# Patient Record
Sex: Female | Born: 1955 | Race: White | Hispanic: No | Marital: Married | Smoking: Former smoker
Health system: Southern US, Community
[De-identification: ages and names within clinical notes are randomized; demographics above are authoritative.]

## PROBLEM LIST (undated history)

## (undated) DIAGNOSIS — R519 Headache, unspecified: Secondary | ICD-10-CM

## (undated) DIAGNOSIS — F419 Anxiety disorder, unspecified: Secondary | ICD-10-CM

## (undated) DIAGNOSIS — R51 Headache: Secondary | ICD-10-CM

## (undated) DIAGNOSIS — C801 Malignant (primary) neoplasm, unspecified: Secondary | ICD-10-CM

## (undated) HISTORY — PX: BREAST EXCISIONAL BIOPSY: SUR124

---

## 1998-08-15 ENCOUNTER — Other Ambulatory Visit: Admission: RE | Admit: 1998-08-15 | Discharge: 1998-08-15 | Payer: Self-pay | Admitting: *Deleted

## 2003-01-25 ENCOUNTER — Other Ambulatory Visit: Admission: RE | Admit: 2003-01-25 | Discharge: 2003-01-25 | Payer: Self-pay | Admitting: Obstetrics and Gynecology

## 2003-03-15 ENCOUNTER — Encounter: Admission: RE | Admit: 2003-03-15 | Discharge: 2003-03-15 | Payer: Self-pay | Admitting: Obstetrics and Gynecology

## 2003-03-15 ENCOUNTER — Encounter: Payer: Self-pay | Admitting: Obstetrics and Gynecology

## 2016-05-24 DIAGNOSIS — C50919 Malignant neoplasm of unspecified site of unspecified female breast: Secondary | ICD-10-CM

## 2016-05-24 HISTORY — DX: Malignant neoplasm of unspecified site of unspecified female breast: C50.919

## 2016-05-24 HISTORY — PX: MASTECTOMY: SHX3

## 2017-02-17 ENCOUNTER — Other Ambulatory Visit: Payer: Self-pay | Admitting: Obstetrics and Gynecology

## 2017-02-17 DIAGNOSIS — N6320 Unspecified lump in the left breast, unspecified quadrant: Secondary | ICD-10-CM

## 2017-02-22 ENCOUNTER — Ambulatory Visit
Admission: RE | Admit: 2017-02-22 | Discharge: 2017-02-22 | Disposition: A | Discharge status: Home / Self Care | Payer: BLUE CROSS/BLUE SHIELD | Source: Ambulatory Visit | Attending: Obstetrics and Gynecology | Admitting: Obstetrics and Gynecology

## 2017-02-22 ENCOUNTER — Other Ambulatory Visit: Payer: Self-pay | Admitting: Obstetrics and Gynecology

## 2017-02-22 DIAGNOSIS — N6320 Unspecified lump in the left breast, unspecified quadrant: Secondary | ICD-10-CM

## 2017-02-22 DIAGNOSIS — R599 Enlarged lymph nodes, unspecified: Secondary | ICD-10-CM

## 2017-02-22 DIAGNOSIS — N6310 Unspecified lump in the right breast, unspecified quadrant: Secondary | ICD-10-CM

## 2017-02-25 ENCOUNTER — Other Ambulatory Visit: Payer: Self-pay | Admitting: Obstetrics and Gynecology

## 2017-02-25 DIAGNOSIS — R928 Other abnormal and inconclusive findings on diagnostic imaging of breast: Secondary | ICD-10-CM

## 2017-02-25 DIAGNOSIS — R922 Inconclusive mammogram: Secondary | ICD-10-CM

## 2017-02-28 ENCOUNTER — Ambulatory Visit
Admission: RE | Admit: 2017-02-28 | Discharge: 2017-02-28 | Disposition: A | Discharge status: Home / Self Care | Payer: BLUE CROSS/BLUE SHIELD | Source: Ambulatory Visit | Attending: Obstetrics and Gynecology | Admitting: Obstetrics and Gynecology

## 2017-02-28 ENCOUNTER — Other Ambulatory Visit: Payer: Self-pay | Admitting: Obstetrics and Gynecology

## 2017-02-28 DIAGNOSIS — N6320 Unspecified lump in the left breast, unspecified quadrant: Secondary | ICD-10-CM

## 2017-02-28 DIAGNOSIS — R599 Enlarged lymph nodes, unspecified: Secondary | ICD-10-CM

## 2017-03-03 ENCOUNTER — Encounter: Payer: Self-pay | Admitting: *Deleted

## 2017-03-03 ENCOUNTER — Telehealth: Payer: Self-pay | Admitting: Hematology and Oncology

## 2017-03-03 DIAGNOSIS — Z170 Estrogen receptor positive status [ER+]: Principal | ICD-10-CM

## 2017-03-03 DIAGNOSIS — C50312 Malignant neoplasm of lower-inner quadrant of left female breast: Secondary | ICD-10-CM | POA: Insufficient documentation

## 2017-03-03 NOTE — Telephone Encounter (Signed)
Confirmed BC appointment for 10/17 in the afternoon, patient wanted intake form emailed to her

## 2017-03-09 ENCOUNTER — Ambulatory Visit
Admission: RE | Admit: 2017-03-09 | Discharge: 2017-03-09 | Disposition: A | Discharge status: Home / Self Care | Payer: BLUE CROSS/BLUE SHIELD | Source: Ambulatory Visit | Attending: Radiation Oncology | Admitting: Radiation Oncology

## 2017-03-09 ENCOUNTER — Encounter: Payer: Self-pay | Admitting: Physical Therapy

## 2017-03-09 ENCOUNTER — Encounter: Payer: Self-pay | Admitting: Hematology and Oncology

## 2017-03-09 ENCOUNTER — Ambulatory Visit: Payer: Self-pay | Admitting: General Surgery

## 2017-03-09 ENCOUNTER — Ambulatory Visit: Payer: BLUE CROSS/BLUE SHIELD | Attending: General Surgery | Admitting: Physical Therapy

## 2017-03-09 ENCOUNTER — Encounter: Payer: Self-pay | Admitting: General Practice

## 2017-03-09 ENCOUNTER — Other Ambulatory Visit (HOSPITAL_BASED_OUTPATIENT_CLINIC_OR_DEPARTMENT_OTHER): Payer: BLUE CROSS/BLUE SHIELD

## 2017-03-09 ENCOUNTER — Ambulatory Visit (HOSPITAL_BASED_OUTPATIENT_CLINIC_OR_DEPARTMENT_OTHER): Payer: BLUE CROSS/BLUE SHIELD | Admitting: Hematology and Oncology

## 2017-03-09 VITALS — BP 165/76 | HR 74 | Temp 98.2°F | Resp 20 | Ht 64.0 in | Wt 135.5 lb

## 2017-03-09 DIAGNOSIS — Z170 Estrogen receptor positive status [ER+]: Principal | ICD-10-CM

## 2017-03-09 DIAGNOSIS — C50312 Malignant neoplasm of lower-inner quadrant of left female breast: Secondary | ICD-10-CM

## 2017-03-09 DIAGNOSIS — R309 Painful micturition, unspecified: Secondary | ICD-10-CM

## 2017-03-09 DIAGNOSIS — R293 Abnormal posture: Secondary | ICD-10-CM | POA: Insufficient documentation

## 2017-03-09 DIAGNOSIS — C50512 Malignant neoplasm of lower-outer quadrant of left female breast: Secondary | ICD-10-CM

## 2017-03-09 DIAGNOSIS — R350 Frequency of micturition: Secondary | ICD-10-CM

## 2017-03-09 LAB — URINALYSIS, MICROSCOPIC - CHCC
Bilirubin (Urine): NEGATIVE
Blood: NEGATIVE
GLUCOSE UR CHCC: NEGATIVE mg/dL
Ketones: 15 mg/dL
LEUKOCYTE ESTERASE: NEGATIVE
NITRITE: NEGATIVE
PH: 6 (ref 4.6–8.0)
PROTEIN: NEGATIVE mg/dL
Specific Gravity, Urine: 1.01 (ref 1.003–1.035)
Urobilinogen, UR: 0.2 mg/dL (ref 0.2–1)

## 2017-03-09 LAB — CBC WITH DIFFERENTIAL/PLATELET
BASO%: 0.2 % (ref 0.0–2.0)
BASOS ABS: 0 10*3/uL (ref 0.0–0.1)
EOS%: 0.6 % (ref 0.0–7.0)
Eosinophils Absolute: 0 10*3/uL (ref 0.0–0.5)
HEMATOCRIT: 41.2 % (ref 34.8–46.6)
HGB: 13.4 g/dL (ref 11.6–15.9)
LYMPH#: 1 10*3/uL (ref 0.9–3.3)
LYMPH%: 20.2 % (ref 14.0–49.7)
MCH: 30.2 pg (ref 25.1–34.0)
MCHC: 32.5 g/dL (ref 31.5–36.0)
MCV: 93 fL (ref 79.5–101.0)
MONO#: 0.4 10*3/uL (ref 0.1–0.9)
MONO%: 8 % (ref 0.0–14.0)
NEUT#: 3.7 10*3/uL (ref 1.5–6.5)
NEUT%: 71 % (ref 38.4–76.8)
PLATELETS: 238 10*3/uL (ref 145–400)
RBC: 4.43 10*6/uL (ref 3.70–5.45)
RDW: 13.4 % (ref 11.2–14.5)
WBC: 5.1 10*3/uL (ref 3.9–10.3)

## 2017-03-09 LAB — COMPREHENSIVE METABOLIC PANEL
ALT: 13 U/L (ref 0–55)
ANION GAP: 8 meq/L (ref 3–11)
AST: 15 U/L (ref 5–34)
Albumin: 4.1 g/dL (ref 3.5–5.0)
Alkaline Phosphatase: 78 U/L (ref 40–150)
BUN: 8.3 mg/dL (ref 7.0–26.0)
CALCIUM: 9.3 mg/dL (ref 8.4–10.4)
CHLORIDE: 108 meq/L (ref 98–109)
CO2: 26 mEq/L (ref 22–29)
Creatinine: 0.7 mg/dL (ref 0.6–1.1)
EGFR: >60 mL/min/{1.73_m2} (ref >60)
Glucose: 99 mg/dl (ref 70–140)
POTASSIUM: 4.2 meq/L (ref 3.5–5.1)
Sodium: 143 mEq/L (ref 136–145)
Total Bilirubin: 0.37 mg/dL (ref 0.20–1.20)
Total Protein: 7.1 g/dL (ref 6.4–8.3)

## 2017-03-09 NOTE — Progress Notes (Signed)
[facility] CONSULT NOTE  Patient Care Team: Richarda Overlie, MD as PCP - General (Obstetrics and Gynecology)  CHIEF COMPLAINTS/PURPOSE OF CONSULTATION:  Newly diagnosed breast cancer  HISTORY OF PRESENTING ILLNESS:  Adriana Jones 61 y.o. female is here because of recent diagnosis of left breast cancer. Patient felt a lump in the left breast and brought to the attention of her physician. She underwent a mammogram and ultrasound. She was noted to have 2 separate lesions one is 2.5 cm at 7:00 position another 2.5 cm at 6:00 position. Axilla is negative. There were additional indeterminate calcifications on the medial and lateral aspects of the masses. 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 Axilla was negative for malignancy. She was presented this morning in the multidisciplinary tumor board and she is here today to discuss the treatment plan.  I reviewed her records extensively and collaborated the history with the patient.  SUMMARY OF ONCOLOGIC HISTORY:   Malignant neoplasm of lower-inner quadrant of left breast in female, estrogen receptor positive ([facility])   02/28/2017 Initial Diagnosis    Palpable left breast masses 2.5 cm at 7:00 and another 2.5 cm at 6:00 1.9 cm apart biopsy: IDC grade 2 with DCISER 100%, PR 0%, Ki-67 20%, HER-2 positiveratio 2.08, axilla negative; in addition indeterminate calcifications medially and laterally,T2 N0 stage 2A clinical stage AJCC 8      MEDICAL HISTORY:  History reviewed. No pertinent past medical history.  SURGICAL HISTORY: Past Surgical History:  Procedure Laterality Date  . BREAST EXCISIONAL BIOPSY      SOCIAL HISTORY: Social History   Social History  . Marital status: Married    Spouse name: N/A  . Number of children: N/A  . Years of education: N/A   Occupational History  . Not on file.   Social History Main  Topics  . Smoking status: Former Smoker    Start date: 08/07/2013  . Smokeless tobacco: Not on file  . Alcohol use Yes     Comment: 4-10/wk  . Drug use: No  . Sexual activity: Not on file   Other Topics Concern  . Not on file   Social History Narrative  . No narrative on file    FAMILY HISTORY: History reviewed. No pertinent family history.  ALLERGIES:  is allergic to amoxicillin and sulfa antibiotics.  MEDICATIONS:  Current Outpatient Prescriptions  Medication Sig Dispense Refill  . Ascorbic Acid (VITAMIN C) 1000 MG tablet Take 1,000 mg by mouth daily.    Marland Kitchen CRANBERRY ULTRA STRENGTH PO Take 8,400 mg by mouth daily.     No current facility-administered medications for this visit.     REVIEW OF SYSTEMS:   Constitutional: Denies fevers, chills or abnormal night sweats Eyes: Denies blurriness of vision, double vision or watery eyes Ears, nose, mouth, throat, and face: Denies mucositis or sore throat Respiratory: Denies cough, dyspnea or wheezes Cardiovascular: Denies palpitation, chest discomfort or lower extremity swelling Gastrointestinal:  Denies nausea, heartburn or change in bowel habits Skin: Denies abnormal skin rashes Lymphatics: Denies new lymphadenopathy or easy bruising Neurological:Denies numbness, tingling or new weaknesses Behavioral/Psych: Mood is stable, no new changes  Breast:  Palpable lumps in the breasts that prompted the evaluation All other systems were reviewed with the patient and are negative.  PHYSICAL EXAMINATION: ECOG PERFORMANCE STATUS: 1 - Symptomatic but completely ambulatory  Vitals:   03/09/17  1258  BP: (!) 165/76  Pulse: 74  Resp: 20  Temp: 98.2 F (36.8 C)  SpO2: 99%   Filed Weights   03/09/17 1258  Weight: 135 lb 8 oz (61.5 kg)    GENERAL:alert, no distress and comfortable SKIN: skin color, texture, turgor are normal, no rashes or significant lesions EYES: normal, conjunctiva are pink and non-injected, sclera  clear OROPHARYNX:no exudate, no erythema and lips, buccal mucosa, and tongue normal  NECK: supple, thyroid normal size, non-tender, without nodularity LYMPH:  no palpable lymphadenopathy in the cervical, axillary or inguinal LUNGS: clear to auscultation and percussion with normal breathing effort HEART: regular rate & rhythm and no murmurs and no lower extremity edema ABDOMEN:abdomen soft, non-tender and normal bowel sounds Musculoskeletal:no cyanosis of digits and no clubbing  PSYCH: alert & oriented x 3 with fluent speech NEURO: no focal motor/sensory deficits BREAST palpable lesions in the left breast  LABORATORY DATA:  I have reviewed the data as listed Lab Results  Component Value Date   WBC 5.1 03/09/2017   HGB 13.4 03/09/2017   HCT 41.2 03/09/2017   MCV 93.0 03/09/2017   PLT 238 03/09/2017   Lab Results  Component Value Date   NA 143 03/09/2017   K 4.2 03/09/2017   CO2 26 03/09/2017    RADIOGRAPHIC STUDIES: I have personally reviewed the radiological reports and agreed with the findings in the report.  ASSESSMENT AND PLAN:  Malignant neoplasm of lower-inner quadrant of left breast in female, estrogen receptor positive ([facility]) 02/28/2017:Palpable left breast masses 2.5 cm at 7:00 and another 2.5 cm at 6:00 1.9 cm apart biopsy: IDC grade 2 with DCIS ER 100%, PR 0%, Ki-67 20%, HER-2 positiveratio 2.08, axilla negative; in addition indeterminate calcifications medially and laterally,T2 N0 stage 2A clinical stage AJCC 8  Pathology and radiology counseling: Discussed with the patient, the details of pathology including the type of breast cancer,the clinical staging, the significance of ER, PR and HER-2/neu receptors and the implications for treatment. After reviewing the pathology in detail, we proceeded to discuss the different treatment options between surgery, radiation, chemotherapy, antiestrogen therapies.  Recommendation: 1. Mastectomy with sentinel lymph node biopsy 2.  Adjuvant chemotherapy depending on the final tumor size this may be Taxol Herceptin versus TCH versus TCH Perjeta. 3. Adjuvant radiation therapy if necessary based upon final pathology 4. Adjuvant antiestrogen therapy  Perform additional biopsies for the indeterminate calcifications that were noted.  Plan: 1. Breast MRI 2. Port placement 3. Chemotherapy class 4. Echocardiogram  Follow-up after surgery to confirm the final treatment plan   All questions were answered. The patient knows to call the clinic with any problems, questions or concerns.    Rulon Eisenmenger, MD 03/09/17

## 2017-03-09 NOTE — Patient Instructions (Signed)

## 2017-03-09 NOTE — Progress Notes (Signed)
Radiation Oncology         [phone] ________________________________  Initial outpatient Consultation  Name: Adriana Jones MRN: [id number]  Date: 03/09/2017  DOB: 06/08/1955  KD:[email or web address], Delfino Lovett, MD  Jovita Kussmaul, MD   REFERRING PHYSICIAN: Autumn Messing III, MD  DIAGNOSIS:    ICD-10-CM   1. Malignant neoplasm of lower-inner quadrant of left breast in female, estrogen receptor positive ([facility]) C50.312    Z17.0    Cancer Staging Malignant neoplasm of lower-inner quadrant of left breast in female, estrogen receptor positive ([facility]) Staging form: Breast, AJCC 8th Edition - Clinical stage from 03/09/2017: Stage IIA (cT2, cN0, cM0, G2, ER: Positive, PR: Negative, HER2: Positive) - Unsigned   CHIEF COMPLAINT: Here to discuss management of left breast cancer.  HISTORY OF PRESENT ILLNESS::Adriana Jones is a 61 y.o. female who presented with palpable left breast mass. Patient has history of left breast excisional biospy. Mammogram revealed, in the post inferior left breast 2 adjacent masses plus the 4 mm group of calcification, in the upper outer quadrant left breast. There was also a 3.9 cm area of calcification in the medial left breast. On ultrasound, one of the masses was 2.5 cm at 7:00 and another was 2.7cm at 6:00. The axilla was noted for two mildly cortically thickening nodes.  She underwent three biopsies;  biopsy of the two breast masses revealed grade 2 Invasive ductal carcinoma with DCIS. ER 100% PR-, Her2+. Biopsy of left axillary node was negative.    Patient denies any SOB, or poor appetite. Heart has some palpitations. Patient reports having headaches, hearing loss, and ear drainage. Patient does use dentures, glasses, and contacts. Patient reports she had a UTI.   PREVIOUS RADIATION THERAPY: No  PAST MEDICAL HISTORY:  has no past medical history on file.    PAST SURGICAL HISTORY: Past Surgical History:  Procedure Laterality Date  . BREAST EXCISIONAL BIOPSY      FAMILY  HISTORY: family history is not on file.  SOCIAL HISTORY:  reports that she has quit smoking. She started smoking about 3 years ago. She does not have any smokeless tobacco history on file. She reports that she drinks alcohol. She reports that she does not use drugs. Patient reports vaping.  ALLERGIES: Amoxicillin and Sulfa antibiotics  MEDICATIONS:  Current Outpatient Prescriptions  Medication Sig Dispense Refill  . Ascorbic Acid (VITAMIN C) 1000 MG tablet Take 1,000 mg by mouth daily.    Marland Kitchen CRANBERRY ULTRA STRENGTH PO Take 8,400 mg by mouth daily.     No current facility-administered medications for this encounter.     REVIEW OF SYSTEMS: A 10+ POINT REVIEW OF SYSTEMS WAS OBTAINED including neurology, dermatology, psychiatry, cardiac, respiratory, lymph, extremities, GI, GU, Musculoskeletal, constitutional, breasts, reproductive, HEENT.  All pertinent positives are noted in the HPI.  All others are negative.   PHYSICAL EXAM:  Oncology Vitals 03/09/2017  Height 163 cm  Weight 61.462 kg  Weight (lbs) 135 lbs 8 oz  BMI (kg/m2) 23.26 kg/m2  Temp 98.2  Pulse 74  Resp 20  SpO2 99  BSA (m2) 1.67 m2   General: Alert and oriented, in no acute distress HEENT: Head is normocephalic. Extraocular movements are intact. Oropharynx is clear. Neck: Neck is supple, no palpable cervical or supraclavicular lymphadenopathy. Heart: Regular in rate and rhythm with no murmurs, rubs, or gallops. Chest: Clear to auscultation bilaterally, with no rhonchi, wheezes, or rales. Abdomen: Soft, nontender, nondistended, with no rigidity or guarding. Extremities: No cyanosis or edema. Skin: No concerning  lesions. Musculoskeletal: symmetric strength and muscle tone throughout. Neurologic: Cranial nerves II through XII are grossly intact. No obvious focalities. Speech is fluent. Coordination is intact. Psychiatric: Judgment and insight are intact. Affect is appropriate. BREAST: No palpable mass on left axilla.  Bruise on left side of breast:  4-5cm  palpable mass with bruise around 7:00 o'clock  Region. No palpable mass on right axilla or breast.     ECOG = 0  0 - Asymptomatic (Fully active, able to carry on all predisease activities without restriction)  1 - Symptomatic but completely ambulatory (Restricted in physically strenuous activity but ambulatory and able to carry out work of a light or sedentary nature. For example, light housework, office work)  2 - Symptomatic, <50% in bed during the day (Ambulatory and capable of all self care but unable to carry out any work activities. Up and about more than 50% of waking hours)  3 - Symptomatic, >50% in bed, but not bedbound (Capable of only limited self-care, confined to bed or chair 50% or more of waking hours)  4 - Bedbound (Completely disabled. Cannot carry on any self-care. Totally confined to bed or chair)  5 - Death   Eustace Pen MM, Creech RH, Tormey DC, et al. [phone]). "Toxicity and response criteria of the [facility]". [place] Oncol. 5 (6): 649-55   LABORATORY DATA:  Lab Results  Component Value Date   WBC 5.1 03/09/2017   HGB 13.4 03/09/2017   HCT 41.2 03/09/2017   MCV 93.0 03/09/2017   PLT 238 03/09/2017   CMP     Component Value Date/Time   NA 143 03/09/2017 1229   K 4.2 03/09/2017 1229   CO2 26 03/09/2017 1229   GLUCOSE 99 03/09/2017 1229   BUN 8.3 03/09/2017 1229   CREATININE 0.7 03/09/2017 1229   CALCIUM 9.3 03/09/2017 1229   PROT 7.1 03/09/2017 1229   ALBUMIN 4.1 03/09/2017 1229   AST 15 03/09/2017 1229   ALT 13 03/09/2017 1229   ALKPHOS 78 03/09/2017 1229   BILITOT 0.37 03/09/2017 1229         RADIOGRAPHY: US Breast Ltd Uni Left Inc Axilla  Result Date: 02/22/2017 CLINICAL DATA:  Patient presents for palpable abnormality within the inferior left breast. EXAM: 2D DIGITAL DIAGNOSTIC BILATERAL MAMMOGRAM WITH CAD AND ADJUNCT TOMO ULTRASOUND BILATERAL BREAST COMPARISON:  Previous exam(s).  ACR Breast Density Category c: The breast tissue is heterogeneously dense, which may obscure small masses. FINDINGS: Two adjacent lobular masses demonstrated within the inferior aspect of the left breast measuring 1.6 x 1.2 cm and 2.8 x 2.6 cm. Additionally there is a 4 mm group of rounded calcifications within the upper-outer left breast. Within the inferior slightly medial left breast there are loosely grouped round and punctate calcifications scattered within an area measuring approximately 3.9 x 3.9 cm. Within the inferior central right breast there is a small focal asymmetry. Mammographic images were processed with CAD. On physical exam, I palpate a firm mass within the inferior left breast. Targeted ultrasound is performed, showing a 2.5 x 1.6 x 2.1 cm mixed echogenicity irregular mass left breast 7 o'clock position 3 cm from the nipple, corresponding with palpable abnormality. Additionally there is a 2.7 x 2.1 x 2.7 cm heterogeneous solid mass within the left breast 6 o'clock position 3 cm from the nipple, adjacent to the palpable abnormality. There are 2 mildly cortically thickened left axillary lymph nodes measuring up to 4 mm and 5 mm in thickness. No  definite sonographic abnormality identified at the site of asymmetry within the right breast. IMPRESSION: Suspicious palpable left breast mass. Adjacent suspicious left breast mass. Indeterminate calcifications within the medial and lateral left breast. There are 2 mildly cortically thickened left axillary lymph nodes. RECOMMENDATION: Ultrasound-guided core needle biopsy of the 2 suspicious irregular hypoechoic left breast masses. Ultrasound-guided core needle biopsy of a cortically thickened left axillary lymph node. Based upon treatment plan, if the patient desires breast conservation therapy, stereotactic guided core needle biopsy of the medial and lateral left breast calcifications would be warranted. Given the breast density and findings on mammography,  consider further evaluation of the right and left breast with bilateral breast MRI. I have discussed the findings and recommendations with the patient. Results were also provided in writing at the conclusion of the visit. If applicable, a reminder letter will be sent to the patient regarding the next appointment. BI-RADS CATEGORY  5: Highly suggestive of malignancy. Electronically Signed   By: Lovey Newcomer M.D.   On: 02/22/2017 12:25   US Breast Ltd Uni Right Inc Axilla  Result Date: 02/22/2017 CLINICAL DATA:  Patient presents for palpable abnormality within the inferior left breast. EXAM: 2D DIGITAL DIAGNOSTIC BILATERAL MAMMOGRAM WITH CAD AND ADJUNCT TOMO ULTRASOUND BILATERAL BREAST COMPARISON:  Previous exam(s). ACR Breast Density Category c: The breast tissue is heterogeneously dense, which may obscure small masses. FINDINGS: Two adjacent lobular masses demonstrated within the inferior aspect of the left breast measuring 1.6 x 1.2 cm and 2.8 x 2.6 cm. Additionally there is a 4 mm group of rounded calcifications within the upper-outer left breast. Within the inferior slightly medial left breast there are loosely grouped round and punctate calcifications scattered within an area measuring approximately 3.9 x 3.9 cm. Within the inferior central right breast there is a small focal asymmetry. Mammographic images were processed with CAD. On physical exam, I palpate a firm mass within the inferior left breast. Targeted ultrasound is performed, showing a 2.5 x 1.6 x 2.1 cm mixed echogenicity irregular mass left breast 7 o'clock position 3 cm from the nipple, corresponding with palpable abnormality. Additionally there is a 2.7 x 2.1 x 2.7 cm heterogeneous solid mass within the left breast 6 o'clock position 3 cm from the nipple, adjacent to the palpable abnormality. There are 2 mildly cortically thickened left axillary lymph nodes measuring up to 4 mm and 5 mm in thickness. No definite sonographic abnormality  identified at the site of asymmetry within the right breast. IMPRESSION: Suspicious palpable left breast mass. Adjacent suspicious left breast mass. Indeterminate calcifications within the medial and lateral left breast. There are 2 mildly cortically thickened left axillary lymph nodes. RECOMMENDATION: Ultrasound-guided core needle biopsy of the 2 suspicious irregular hypoechoic left breast masses. Ultrasound-guided core needle biopsy of a cortically thickened left axillary lymph node. Based upon treatment plan, if the patient desires breast conservation therapy, stereotactic guided core needle biopsy of the medial and lateral left breast calcifications would be warranted. Given the breast density and findings on mammography, consider further evaluation of the right and left breast with bilateral breast MRI. I have discussed the findings and recommendations with the patient. Results were also provided in writing at the conclusion of the visit. If applicable, a reminder letter will be sent to the patient regarding the next appointment. BI-RADS CATEGORY  5: Highly suggestive of malignancy. Electronically Signed   By: Lovey Newcomer M.D.   On: 02/22/2017 12:25   Mm Diag Breast Tomo Bilateral  Result Date:  02/22/2017 CLINICAL DATA:  Patient presents for palpable abnormality within the inferior left breast. EXAM: 2D DIGITAL DIAGNOSTIC BILATERAL MAMMOGRAM WITH CAD AND ADJUNCT TOMO ULTRASOUND BILATERAL BREAST COMPARISON:  Previous exam(s). ACR Breast Density Category c: The breast tissue is heterogeneously dense, which may obscure small masses. FINDINGS: Two adjacent lobular masses demonstrated within the inferior aspect of the left breast measuring 1.6 x 1.2 cm and 2.8 x 2.6 cm. Additionally there is a 4 mm group of rounded calcifications within the upper-outer left breast. Within the inferior slightly medial left breast there are loosely grouped round and punctate calcifications scattered within an area measuring  approximately 3.9 x 3.9 cm. Within the inferior central right breast there is a small focal asymmetry. Mammographic images were processed with CAD. On physical exam, I palpate a firm mass within the inferior left breast. Targeted ultrasound is performed, showing a 2.5 x 1.6 x 2.1 cm mixed echogenicity irregular mass left breast 7 o'clock position 3 cm from the nipple, corresponding with palpable abnormality. Additionally there is a 2.7 x 2.1 x 2.7 cm heterogeneous solid mass within the left breast 6 o'clock position 3 cm from the nipple, adjacent to the palpable abnormality. There are 2 mildly cortically thickened left axillary lymph nodes measuring up to 4 mm and 5 mm in thickness. No definite sonographic abnormality identified at the site of asymmetry within the right breast. IMPRESSION: Suspicious palpable left breast mass. Adjacent suspicious left breast mass. Indeterminate calcifications within the medial and lateral left breast. There are 2 mildly cortically thickened left axillary lymph nodes. RECOMMENDATION: Ultrasound-guided core needle biopsy of the 2 suspicious irregular hypoechoic left breast masses. Ultrasound-guided core needle biopsy of a cortically thickened left axillary lymph node. Based upon treatment plan, if the patient desires breast conservation therapy, stereotactic guided core needle biopsy of the medial and lateral left breast calcifications would be warranted. Given the breast density and findings on mammography, consider further evaluation of the right and left breast with bilateral breast MRI. I have discussed the findings and recommendations with the patient. Results were also provided in writing at the conclusion of the visit. If applicable, a reminder letter will be sent to the patient regarding the next appointment. BI-RADS CATEGORY  5: Highly suggestive of malignancy. Electronically Signed   By: Lovey Newcomer M.D.   On: 02/22/2017 12:25   [place] Axillary Node Core Biopsy  Left  Addendum Date: 03/03/2017   ADDENDUM REPORT: 03/02/2017 14:24 ADDENDUM: Pathology revealed GRADE II INVASIVE DUCTAL CARCINOMA, DUCTAL CARCINOMA IN SITU of the Left breast, 7:00 o'clock (ribbon clip). GRADE II INVASIVE DUCTAL CARCINOMA of the Left breast, 6:00 o'clock (coil clip). Left axillary lymph node is NEGATIVE FOR CARCINOMA, (HydroMARK clip). This was found to be concordant by Dr. Curlene Dolphin. Pathology results were discussed with the patient by telephone. The patient reported doing well after the biopsies with tenderness at the sites. Post biopsy instructions and care were reviewed and questions were answered. The patient was encouraged to call [facility] for any additional concerns. The patient was referred to [facility] on March 09, 2017. Pathology results reported by Terie Purser, RN on 03/02/2017. Electronically Signed   By: Curlene Dolphin M.D.   On: 03/02/2017 14:24   Result Date: 03/03/2017 CLINICAL DATA:  Ultrasound-guided biopsy of a left axillary lymph node with mild cortical thickening was recommended. The patient also had 2 left breast biopsies today, dictated separately. EXAM:  [place] AXILLARY NODE CORE BIOPSY LEFT COMPARISON:  Previous exam(s). FINDINGS: I met with the patient and we discussed the procedure of ultrasound-guided biopsy, including benefits and alternatives. We discussed the high likelihood of a successful procedure. We discussed the risks of the procedure, including infection, bleeding, tissue injury, clip migration, and inadequate sampling. Informed written consent was given. The usual time-out protocol was performed immediately prior to the procedure. Using sterile technique and 1% Lidocaine as local anesthetic, under direct ultrasound visualization, a 14 gauge spring-loaded device was used to perform biopsy of an axillary lymph node with an approximately 4 mm  cortical thickness using a lateral to medial approach. At the conclusion of the procedure a HydroMARK tissue marker clip was deployed into the biopsy cavity. Follow up 2 view mammogram was performed and dictated separately. IMPRESSION: Ultrasound guided biopsy of a left axillary lymph node. No apparent complications. Electronically Signed: By: Curlene Dolphin M.D. On: 02/28/2017 16:10   Mm Clip Placement Left  Result Date: 02/28/2017 CLINICAL DATA:  Two left breast biopsies and a left axillary lymph node biopsy were performed today under ultrasound guidance. EXAM: DIAGNOSTIC LEFT MAMMOGRAM POST ULTRASOUND BIOPSIES COMPARISON:  Previous exam(s). FINDINGS: Mammographic images were obtained following ultrasound guided biopsies of 2 inferior left breast masses and of a left axillary lymph node. The ribbon shaped clip is positioned in the inferior left breast, but is more lateral than expected given that the mass was in the lower inner quadrant. This biopsy clip may have migrated slightly laterally. Coil shaped biopsy clip is satisfactorily positioned 6 o'clock region of the left breast. The axillary lymph node biopsy clip is not visualized on the mammogram, but could be seen on ultrasound at the time of biopsy. IMPRESSION: 1. Ribbon shaped biopsy clip in the inferior left breast is slightly more lateral than expected, and may have migrated slightly laterally. 2. Satisfactory position of coil shaped biopsy clip in the 6 o'clock position mass. 3. The axillary node biopsy clip is not visualized on in the mammogram, likely due to its position outside the field of view. Final Assessment: Post Procedure Mammograms for Marker Placement Electronically Signed   By: Curlene Dolphin M.D.   On: 02/28/2017 17:00   [place] Lt Breast Bx W Loc Dev 1st Lesion Img Bx Spec US Guide  Addendum Date: 03/03/2017   ADDENDUM REPORT: 03/02/2017 14:23 ADDENDUM: Pathology revealed GRADE II INVASIVE DUCTAL CARCINOMA, DUCTAL CARCINOMA IN SITU of the  Left breast, 7:00 o'clock (ribbon clip). GRADE II INVASIVE DUCTAL CARCINOMA of the Left breast, 6:00 o'clock (coil clip). Left axillary lymph node is NEGATIVE FOR CARCINOMA, (HydroMARK clip). This was found to be concordant by Dr. Curlene Dolphin. Pathology results were discussed with the patient by telephone. The patient reported doing well after the biopsies with tenderness at the sites. Post biopsy instructions and care were reviewed and questions were answered. The patient was encouraged to call [facility] for any additional concerns. The patient was referred to [facility] at [facility] on March 09, 2017. Pathology results reported by Terie Purser, RN on 03/02/2017. Electronically Signed   By: Curlene Dolphin M.D.   On: 03/02/2017 14:23   Result Date: 03/03/2017 CLINICAL DATA:  61 year old patient presents for 3 ultrasound-guided biopsies today. This report describes a biopsy of the left breast mass at 7 o'clock position 3 cm from the nipple. EXAM: ULTRASOUND GUIDED LEFT BREAST CORE NEEDLE BIOPSY COMPARISON:  Previous exam(s). FINDINGS:  I met with the patient and we discussed the procedure of ultrasound-guided biopsy, including benefits and alternatives. We discussed the high likelihood of a successful procedure. We discussed the risks of the procedure, including infection, bleeding, tissue injury, clip migration, and inadequate sampling. Informed written consent was given. The usual time-out protocol was performed immediately prior to the procedure. Lesion quadrant: Lower inner quadrant Using sterile technique and 1% Lidocaine as local anesthetic, under direct ultrasound visualization, a 12 gauge spring-loaded device was used to perform biopsy of a palpable suspicious mass approximately 7 o'clock position 3 cm from the nipple using a medial to lateral approach. At the conclusion of the procedure a ribbon shaped tissue  marker clip was deployed into the biopsy cavity. Follow up 2 view mammogram was performed and dictated separately. IMPRESSION: Ultrasound guided biopsy of left breast mass at 7 o'clock position 3 cm from the nipple. No apparent complications. Electronically Signed: By: Curlene Dolphin M.D. On: 02/28/2017 16:06   [place] Lt Breast Bx W Loc Dev Ea Add Lesion Img Bx Spec US Guide  Addendum Date: 03/03/2017   ADDENDUM REPORT: 03/02/2017 14:24 ADDENDUM: Pathology revealed GRADE II INVASIVE DUCTAL CARCINOMA, DUCTAL CARCINOMA IN SITU of the Left breast, 7:00 o'clock (ribbon clip). GRADE II INVASIVE DUCTAL CARCINOMA of the Left breast, 6:00 o'clock (coil clip). Left axillary lymph node is NEGATIVE FOR CARCINOMA, (HydroMARK clip). This was found to be concordant by Dr. Curlene Dolphin. Pathology results were discussed with the patient by telephone. The patient reported doing well after the biopsies with tenderness at the sites. Post biopsy instructions and care were reviewed and questions were answered. The patient was encouraged to call [facility] for any additional concerns. The patient was referred to [facility] at [facility] on March 09, 2017. Pathology results reported by Terie Purser, RN on 03/02/2017. Electronically Signed   By: Curlene Dolphin M.D.   On: 03/02/2017 14:24   Result Date: 03/03/2017 CLINICAL DATA:  61 year old patient presents for biopsies of 2 left breast masses and a left axillary lymph node. This biopsy describes the mass in the 6 o'clock region of the left breast near the inframammary fold. There is overlying skin dimpling. EXAM: ULTRASOUND GUIDED LEFT BREAST CORE NEEDLE BIOPSY COMPARISON:  Previous exam(s). FINDINGS: I met with the patient and we discussed the procedure of ultrasound-guided biopsy, including benefits and alternatives. We discussed the high likelihood of a successful procedure. We discussed the  risks of the procedure, including infection, bleeding, tissue injury, clip migration, and inadequate sampling. Informed written consent was given. The usual time-out protocol was performed immediately prior to the procedure. Lesion quadrant: Lower outer quadrant Using sterile technique and 1% Lidocaine as local anesthetic, under direct ultrasound visualization, a 12 gauge spring-loaded device was used to perform biopsy of an irregular mass in the 6 o'clock position near the inframammary fold, with possible extension into the overlying dermis using a lateral to medial approach. At the conclusion of the procedure a coil shaped tissue marker clip was deployed into the biopsy cavity. Follow up 2 view mammogram was performed and dictated separately. IMPRESSION: Ultrasound guided biopsy of left breast 6 o'clock position near the inframammary fold. No apparent complications. Electronically Signed: By: Curlene Dolphin M.D. On: 02/28/2017 16:09      IMPRESSION/PLAN: Left Breast Cancer   Recommended Mrs. Harding to stop vaping for her overall health.  Consensus is to proceed with mastectomy, followed by adjuvant systemic  therapy and radiotherapy only of needed. I explained that radiotherapy can be warranted in the post mastectomy setting, if nodes are positive, margins are positive, or the invasive extensive  disease expands several centimeters more than we anticipate. At this time clinically, she does not appear to need radiation, but that may change once we review her pathology. I will see her PRN.    __________________________________________   Eppie Gibson, MD  This document serves as a record of services personally performed by Eppie Gibson MD. It was created on her behalf by Delton Coombes, a trained medical scribe. The creation of this record is based on the scribe's personal observations and the provider's statements to them. This document has been checked and approved by the attending provider.

## 2017-03-09 NOTE — Assessment & Plan Note (Signed)
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 After reviewing the pathology in detail, we proceeded to discuss the different treatment options between surgery, radiation, chemotherapy, antiestrogen therapies.  Recommendation: 1. Neoadjuvant chemotherapy with [place] Perjeta every 3 weeks 6 cycles followed by Herceptin maintenance for 1 2. Breast conserving surgery with sentinel lymph node biopsy 3. Adjuvant radiation therapy followed by 4. Adjuvant antiestrogen therapy  perform additional biopsies for the indeterminate calcifications that were noted. Plan: 1. Breast MRI 2. Port placement 3. Chemotherapy class 4. Echocardiogram  Follow-up in 2 weeks to start chemotherapy

## 2017-03-09 NOTE — Progress Notes (Signed)
[place] Psychosocial Distress Screening Spiritual Care  Met with Adriana Jones in [facility] to introduce [facility] team/resources, reviewing distress screen per protocol.  The patient scored a 7 on the Psychosocial Distress Thermometer which indicates severe distress. Also assessed for distress and other psychosocial needs.   ONCBCN DISTRESS SCREENING 03/09/2017  Screening Type Initial Screening  Distress experienced in past week (1-10) 7  Information Concerns Type Lack of info about diagnosis;Lack of info about treatment;Lack of info about complementary therapy choices;Lack of info about maintaining fitness  Referral to support programs Yes   Adriana Jones was still physically anxious during this encounter (shaky/jittery), but she verbalized feeling much relieved at the scope of her diagnosis after feeling so much fear of the unknown.  Per pt, this relief and information will help her assuage some of her son's anxiety.  Per pt, her husband, who has Engineer, production and management background, is currently in [place] and will also be relieved to have specific information with which to discuss her situation.    Follow up needed: No.  Per pt, she has no other needs or questions at this time, but plans to contact Support Team as needs/interest/questions arise.   [place], [place], [facility] Pager [phone] Voicemail [phone]

## 2017-03-09 NOTE — Therapy (Addendum)
[facility] [address] [place], [place], [address] Phone: [phone]   Fax:  [phone]  Physical Therapy Evaluation  Patient Details  Name: Adriana Jones MRN: [id number] Date of Birth: 01-Oct-1955 Referring Provider: Dr. Chevis Pretty  Encounter Date: 03/09/2017      PT End of Session - 03/09/17 1640    Visit Number 1   Number of Visits 2   Date for PT Re-Evaluation 05/09/17   PT Start Time 1438   PT Stop Time 1445  Also saw pt from [phone] for a total of 22 minutes   PT Time Calculation (min) 7 min   Activity Tolerance Patient tolerated treatment well   Behavior During Therapy [facility] for tasks assessed/performed      History reviewed. No pertinent past medical history.  Past Surgical History:  Procedure Laterality Date  . BREAST EXCISIONAL BIOPSY      There were no vitals filed for this visit.       Subjective Assessment - 03/09/17 1636    Subjective Patient reports she is here to meet with her medical team for her nelwy diagnosed left breast cancer.   Pertinent History Patient was diagnosed on 02/22/17 with left grade 2 invasive ductal carcinoma breast cancer. It measures 2.7 cm and 2.5 cm with both masses located in the lower inner quadrant. It is ER positive, PR negative, and HER2 positive with a Ki67 of 25%.    Patient Stated Goals reduce lymphedema risk and learn post op shoulder ROM HEP   Currently in Pain? No/denies            [facility] PT Assessment - 03/09/17 0001      Assessment   Medical Diagnosis Left breast cancer   Referring Provider Dr. Chevis Pretty   Onset Date/Surgical Date 02/22/17   Hand Dominance Left   Prior Therapy none     Precautions   Precautions Other (comment)   Precaution Comments active cancer     Restrictions   Weight Bearing Restrictions No     Balance Screen   Has the patient fallen in the past 6 months No   Has the patient had a decrease in activity level because of a fear  of falling?  No   Is the patient reluctant to leave their home because of a fear of falling?  No     Home Nurse, mental health Private residence   Living Arrangements Spouse/significant other;Children  Husband and 80 y.o. son   Available Help at Discharge Family     Prior Function   Level of Independence Independent   Vocation Unemployed   Leisure She exercises with her dogs but no formal exercise     Cognition   Overall Cognitive Status Within Functional Limits for tasks assessed     Posture/Postural Control   Posture/Postural Control Postural limitations   Postural Limitations Forward head;Rounded Shoulders     ROM / Strength   AROM / PROM / Strength AROM;Strength     AROM   AROM Assessment Site Shoulder;Cervical   Right/Left Shoulder Right;Left   Right Shoulder Extension 58 Degrees   Right Shoulder Flexion 165 Degrees   Right Shoulder ABduction 172 Degrees   Right Shoulder Internal Rotation 73 Degrees   Right Shoulder External Rotation 80 Degrees   Left Shoulder Extension 65 Degrees   Left Shoulder Flexion 165 Degrees   Left Shoulder ABduction 160 Degrees   Left Shoulder Internal Rotation 72 Degrees   Left Shoulder External Rotation  73 Degrees   Cervical Flexion WNL   Cervical Extension WNL   Cervical - Right Side Bend WNL   Cervical - Left Side Bend WNL   Cervical - Right Rotation WNL   Cervical - Left Rotation WNL     Strength   Overall Strength Within functional limits for tasks performed           LYMPHEDEMA/ONCOLOGY QUESTIONNAIRE - 03/09/17 1639      Type   Cancer Type Left breast cancer     Lymphedema Assessments   Lymphedema Assessments Upper extremities     Right Upper Extremity Lymphedema   10 cm Proximal to Olecranon Process 25.2 cm   Olecranon Process 22.8 cm   10 cm Proximal to Ulnar Styloid Process 20.8 cm   Just Proximal to Ulnar Styloid Process 14.7 cm   Across Hand at [employer] 17.1 cm   At [place] of 2nd Digit 6 cm      Left Upper Extremity Lymphedema   10 cm Proximal to Olecranon Process 26 cm   Olecranon Process 23.3 cm   10 cm Proximal to Ulnar Styloid Process 20 cm   Just Proximal to Ulnar Styloid Process 14.6 cm   Across Hand at [employer] 16.7 cm   At [place] of 2nd Digit 5.8 cm         Objective measurements completed on examination: See above findings.     Patient was instructed today in a home exercise program today for post op shoulder range of motion. These included active assist shoulder flexion in sitting, scapular retraction, wall walking with shoulder abduction, and hands behind head external rotation.  She was encouraged to do these twice a day, holding 3 seconds and repeating 5 times when permitted by her physician.          PT Education - 03/09/17 1640    Education provided Yes   Education Details Lymphedema risk reduction and post op shoulder ROM HEP   Person(s) Educated Patient   Methods Explanation;Demonstration;Handout   Comprehension Returned demonstration;Verbalized understanding              Breast Clinic Goals - 03/09/17 1644      Patient will be able to verbalize understanding of pertinent lymphedema risk reduction practices relevant to her diagnosis specifically related to skin care.   Time 1   Period Days   Status Achieved     Patient will be able to return demonstrate and/or verbalize understanding of the post-op home exercise program related to regaining shoulder range of motion.   Time 1   Period Days   Status Achieved     Patient will be able to verbalize understanding of the importance of attending the postoperative After Breast Cancer Class for further lymphedema risk reduction education and therapeutic exercise.   Time 1   Period Days   Status Achieved               Plan - 03/09/17 1642    Clinical Impression Statement Patient was diagnosed on 02/22/17 with left grade 2 invasive ductal carcinoma breast cancer. It measures  2.7 cm and 2.5 cm with both masses located in the lower inner quadrant. It is ER positive, PR negative, and HER2 positive with a Ki67 of 25%. Her multidisciplinary medical team met prior to her assessments to determine a recommended treatment plan. She is planning to have a left mastectomy and sentinel node biopsy followed by chemotherapy, possibly radiation, and anti-estrogen therapy. She may  benefit from post op PT to regain shoulder ROM and reduce lymphedema risk.   History and Personal Factors relevant to plan of care: None   Clinical Presentation Stable   Clinical Decision Making Low   Rehab Potential Excellent   Clinical Impairments Affecting Rehab Potential None   PT Frequency --  eval and 1 f/u visit to determine PT needs   PT Treatment/Interventions ADLs/Self Care Home Management;Therapeutic exercise;Patient/family education   PT Next Visit Plan Will f/u 3-4 weeks post op to reassess and determine PT needs   PT Home Exercise Plan Post op shoulder ROM HEP   Consulted and Agree with Plan of Care Patient      Patient will benefit from skilled therapeutic intervention in order to improve the following deficits and impairments:  Decreased range of motion, Impaired UE functional use, Decreased knowledge of precautions, Pain, Postural dysfunction  Visit Diagnosis: Malignant neoplasm of lower-inner quadrant of left breast in female, estrogen receptor positive ([place]) - Plan: PT plan of care cert/re-cert  Abnormal posture - Plan: PT plan of care cert/re-cert   Patient will follow up at outpatient cancer rehab 3-4 weeks following surgery.  If the patient requires physical therapy at that time, a specific plan will be dictated and sent to the referring physician for approval. The patient was educated today on appropriate basic range of motion exercises to begin post operatively and the importance of attending the After Breast Cancer class following surgery.  Patient was educated today on  lymphedema risk reduction practices as it pertains to recommendations that will benefit the patient immediately following surgery.  She verbalized good understanding.     Problem List Patient Active Problem List   Diagnosis Date Noted  . Malignant neoplasm of lower-inner quadrant of left breast in female, estrogen receptor positive ([place]) 03/03/2017   Annia Friendly, PT 03/09/17 4:46 PM  [place], [place], [address] Phone: [phone]   Fax:  [phone]  Name: Adriana Jones MRN: [id number] Date of Birth: 1955/11/28   PHYSICAL THERAPY DISCHARGE SUMMARY  Visits from Start of Care: 1  Current functional level related to goals / functional outcomes: Unable to assess as pt did not return post operatively.   Remaining deficits: Unable to assess as pt did not return post operatively.   Education / Equipment: HEP  Plan: Patient agrees to discharge.  Patient goals were partially met. Patient is being discharged due to not returning since the last visit.  ?????        Annia Friendly, [place] 06/27/17 2:44 PM

## 2017-03-09 NOTE — Progress Notes (Signed)
Nutrition Assessment  Reason for Assessment:  Pt seen in Breast Clinic  ASSESSMENT:   61 year old female with new diagnosis of breast cancer.  Past medical history reviewed.  Patient reports normal appetite  Medications:  Vit C, cranberry ultra  Labs: reviewed  Anthropometrics:   Height: 64 inches Weight: 135 lb BMI: 23   NUTRITION DIAGNOSIS: Food and nutrition related knowledge deficit related to new diagnosis of breast cancer as evidenced by no prior need for nutrition related information.  INTERVENTION:   Discussed and provided packet of information regarding nutritional tips for breast cancer patients.  Questions answered.  Teachback method used.  Contact information provided and patient knows to contact me with questions/concerns.    MONITORING, EVALUATION, and GOAL: Pt will consume a healthy plant based diet to maintain lean body mass throughout treatment.   Raheim Beutler B. Zenia Resides, [place], [place] Registered Dietitian [phone] (pager)

## 2017-03-12 LAB — URINE CULTURE: Organism ID, Bacteria: NO GROWTH

## 2017-03-14 ENCOUNTER — Telehealth (HOSPITAL_COMMUNITY): Payer: Self-pay | Admitting: Vascular Surgery

## 2017-03-14 ENCOUNTER — Encounter (HOSPITAL_COMMUNITY): Payer: Self-pay | Admitting: Vascular Surgery

## 2017-03-14 NOTE — Telephone Encounter (Signed)
Sent pt letter to make NP brst appt w/ echo

## 2017-03-15 ENCOUNTER — Telehealth: Payer: Self-pay | Admitting: *Deleted

## 2017-03-15 NOTE — Telephone Encounter (Signed)
  Oncology Nurse Navigator Documentation  Navigator Location: [facility]- (03/15/17 1400)   )Navigator Encounter Type: Telephone (03/15/17 1400) Telephone: Outgoing Call;Clinic/MDC Follow-up (03/15/17 1400)                                                  Time Spent with Patient: 15 (03/15/17 1400)

## 2017-03-22 ENCOUNTER — Telehealth: Payer: Self-pay | Admitting: Hematology and Oncology

## 2017-03-22 NOTE — Telephone Encounter (Signed)
Left message for patient regarding update to schedule. Patient scheduled per 10/25 sch message.

## 2017-03-25 NOTE — Pre-Procedure Instructions (Signed)
Larene Ascencio  03/25/2017      [facility] - [place], [place] - [address] [place] [address] Phone: [phone] Fax: [phone]  [facility] [place], [place] - [address] [place] [address] Phone: [phone] Fax: [phone]    Your procedure is scheduled on November 12  Report to [place] at [place].M.  Call this number if you have problems the morning of surgery:  [phone]   Remember:  Do not eat food or drink liquids after midnight.  Continue all other medications as directed by your physician except follow these medication instructions before surgery   Take these medicines the morning of surgery with A SIP OF WATER NONE  7 days prior to surgery STOP taking any Aspirin (unless otherwise instructed by your surgeon), Aleve, Naproxen, Ibuprofen, Motrin, Advil, Goody's, BC's, all herbal medications, fish oil, and all vitamins    Do not wear jewelry, make-up or nail polish.  Do not wear lotions, powders, or perfumes, or deoderant.  Do not shave 48 hours prior to surgery.   Do not bring valuables to the hospital.  [facility] is not responsible for any belongings or valuables.  Contacts, dentures or bridgework may not be worn into surgery.  Leave your suitcase in the car.  After surgery it may be brought to your room.  For patients admitted to the hospital, discharge time will be determined by your treatment team.  Patients discharged the day of surgery will not be allowed to drive home.    Special instructions:   [facility]- Preparing For Surgery  Before surgery, you can play an important role. Because skin is not sterile, your skin needs to be as free of germs as possible. You can reduce the number of germs on your skin by washing with CHG (chlorahexidine gluconate) Soap before surgery.  CHG is an antiseptic cleaner  which kills germs and bonds with the skin to continue killing germs even after washing.  Please do not use if you have an allergy to CHG or antibacterial soaps. If your skin becomes reddened/irritated stop using the CHG.  Do not shave (including legs and underarms) for at least 48 hours prior to first CHG shower. It is OK to shave your face.  Please follow these instructions carefully.   1. Shower the NIGHT BEFORE SURGERY and the MORNING OF SURGERY with CHG.   2. If you chose to wash your hair, wash your hair first as usual with your normal shampoo.  3. After you shampoo, rinse your hair and body thoroughly to remove the shampoo.  4. Use CHG as you would any other liquid soap. You can apply CHG directly to the skin and wash gently with a scrungie or a clean washcloth.   5. Apply the CHG Soap to your body ONLY FROM THE NECK DOWN.  Do not use on open wounds or open sores. Avoid contact with your eyes, ears, mouth and genitals (private parts). Wash Face and genitals (private parts)  with your normal soap.  6. Wash thoroughly, paying special attention to the area where your surgery will be performed.  7. Thoroughly rinse your body with warm water from the neck down.  8. DO NOT shower/wash with your normal soap after using and rinsing off the CHG Soap.  9. Pat yourself dry with a CLEAN  TOWEL.  10. Wear CLEAN PAJAMAS to bed the night before surgery, wear comfortable clothes the morning of surgery  11. Place CLEAN SHEETS on your bed the night of your first shower and DO NOT SLEEP WITH PETS.    Day of Surgery: Do not apply any deodorants/lotions. Please wear clean clothes to the hospital/surgery center.      Please read over the following fact sheets that you were given.

## 2017-03-28 ENCOUNTER — Encounter (HOSPITAL_COMMUNITY)
Admission: RE | Admit: 2017-03-28 | Discharge: 2017-03-28 | Disposition: A | Discharge status: Home / Self Care | Payer: BLUE CROSS/BLUE SHIELD | Source: Ambulatory Visit | Attending: General Surgery | Admitting: General Surgery

## 2017-03-28 ENCOUNTER — Ambulatory Visit (HOSPITAL_COMMUNITY)
Admission: RE | Admit: 2017-03-28 | Discharge: 2017-03-28 | Disposition: A | Discharge status: Home / Self Care | Payer: BLUE CROSS/BLUE SHIELD | Source: Ambulatory Visit | Attending: Obstetrics and Gynecology | Admitting: Obstetrics and Gynecology

## 2017-03-28 ENCOUNTER — Ambulatory Visit (HOSPITAL_BASED_OUTPATIENT_CLINIC_OR_DEPARTMENT_OTHER)
Admission: RE | Admit: 2017-03-28 | Discharge: 2017-03-28 | Disposition: A | Discharge status: Home / Self Care | Payer: BLUE CROSS/BLUE SHIELD | Source: Ambulatory Visit | Attending: Internal Medicine | Admitting: Internal Medicine

## 2017-03-28 ENCOUNTER — Encounter (HOSPITAL_COMMUNITY): Payer: Self-pay

## 2017-03-28 VITALS — BP 156/84 | HR 87 | Wt 136.0 lb

## 2017-03-28 DIAGNOSIS — F419 Anxiety disorder, unspecified: Secondary | ICD-10-CM | POA: Insufficient documentation

## 2017-03-28 DIAGNOSIS — F17210 Nicotine dependence, cigarettes, uncomplicated: Secondary | ICD-10-CM | POA: Diagnosis not present

## 2017-03-28 DIAGNOSIS — Z880 Allergy status to penicillin: Secondary | ICD-10-CM | POA: Diagnosis not present

## 2017-03-28 DIAGNOSIS — Z79899 Other long term (current) drug therapy: Secondary | ICD-10-CM | POA: Insufficient documentation

## 2017-03-28 DIAGNOSIS — I110 Hypertensive heart disease with heart failure: Secondary | ICD-10-CM | POA: Diagnosis not present

## 2017-03-28 DIAGNOSIS — C50312 Malignant neoplasm of lower-inner quadrant of left female breast: Secondary | ICD-10-CM

## 2017-03-28 DIAGNOSIS — Z170 Estrogen receptor positive status [ER+]: Secondary | ICD-10-CM

## 2017-03-28 DIAGNOSIS — I509 Heart failure, unspecified: Secondary | ICD-10-CM | POA: Diagnosis not present

## 2017-03-28 HISTORY — DX: Headache, unspecified: R51.9

## 2017-03-28 HISTORY — DX: Headache: R51

## 2017-03-28 HISTORY — DX: Malignant (primary) neoplasm, unspecified: C80.1

## 2017-03-28 HISTORY — DX: Anxiety disorder, unspecified: F41.9

## 2017-03-28 LAB — BASIC METABOLIC PANEL
Anion gap: 7 (ref 5–15)
BUN: 10 mg/dL (ref 6–20)
CHLORIDE: 109 mmol/L (ref 101–111)
CO2: 23 mmol/L (ref 22–32)
CREATININE: 0.58 mg/dL (ref 0.44–1.00)
Calcium: 9.3 mg/dL (ref 8.9–10.3)
GFR calc non Af Amer: >60 mL/min (ref >60)
GLUCOSE: 110 mg/dL — AB (ref 65–99)
Potassium: 4.3 mmol/L (ref 3.5–5.1)
Sodium: 139 mmol/L (ref 135–145)

## 2017-03-28 LAB — ECHOCARDIOGRAM COMPLETE
Height: 64 in
WEIGHTICAEL: 2192 [oz_av]

## 2017-03-28 LAB — CBC
HEMATOCRIT: 41.1 % (ref 36.0–46.0)
HEMOGLOBIN: 13.4 g/dL (ref 12.0–15.0)
MCH: 30.1 pg (ref 26.0–34.0)
MCHC: 32.6 g/dL (ref 30.0–36.0)
MCV: 92.4 fL (ref 78.0–100.0)
Platelets: 219 10*3/uL (ref 150–400)
RBC: 4.45 MIL/uL (ref 3.87–5.11)
RDW: 13.4 % (ref 11.5–15.5)
WBC: 4.9 10*3/uL (ref 4.0–10.5)

## 2017-03-28 MED ORDER — CHLORHEXIDINE GLUCONATE CLOTH 2 % EX PADS: 6.0000 | MEDICATED_PAD | Freq: Once | CUTANEOUS | Status: DC

## 2017-03-28 NOTE — Progress Notes (Signed)
  Echocardiogram 2D Echocardiogram has been performed.  Adriana Jones 03/28/2017, 11:37 AM

## 2017-03-28 NOTE — Addendum Note (Signed)
Encounter addended by: Jolaine Artist, MD on: 03/28/2017 8:51 PM  Actions taken: LOS modified

## 2017-03-28 NOTE — Patient Instructions (Signed)
Follow up and echo with Dr.Bensimhon in 3 months.

## 2017-03-28 NOTE — Progress Notes (Addendum)
Cardio-Oncology Clinic Consult Note   Referring Physician: Dr. Lindi Adie Primary Cardiologist: New (Dr. Haroldine Laws)  HPI: Adriana Jones is a 61 y.o. female with past medical history of Left Breast CA who is referred by Dr. Lindi Adie  to establish in the cardio-oncology clinic for monitoring of cardio-toxicty while undergoing chemotherapy.  Cancer Profile  Malignant neoplasm of lower-inner quadrant of left breast in female, estrogen receptor positive (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 Mastectomy with sentinel lymph node biopsy 2. Adjuvant chemotherapy depending on the final tumor size this may be Taxol Herceptin versus TCH versus TCH Perjeta. 3. Adjuvant radiation therapy if necessary based upon final pathology 4. Adjuvant antiestrogen therapy  Today she presents as new patient for the cardio-onc surveillance. She is a smoker. Denies h/o known cardiac disease. Recently diagnosed with left breast CA. Says she knows she can beat it but is not sure she wants to get chemotherapy and put all those "toxins" in her body. Scheduled for surgery next week and taking a wait-and-see approach. Denies SOB/PND/Orthopnea. Appetite ok. No fever or chills. No CP. Taking all medications.   No family history of coronary disease.   Echo today shows - EF 60-65% GLS -22.9 Lateral S'10.6   Review of Systems: [y] = yes, '[ ]'$  = no .   General: Weight gain '[ ]'$ ; Weight loss '[ ]'$ ; Anorexia '[ ]'$ ; Fatigue '[ ]'$ ; Fever '[ ]'$ ; Chills '[ ]'$ ; Weakness '[ ]'$   Cardiac: Chest pain/pressure '[ ]'$ ; Resting SOB '[ ]'$ ; Exertional SOB '[ ]'$ ; Orthopnea '[ ]'$ ; Pedal Edema '[ ]'$ ; Palpitations '[ ]'$ ; Syncope '[ ]'$ ; Presyncope '[ ]'$ ; Paroxysmal nocturnal dyspnea'[ ]'$   Pulmonary: Cough '[ ]'$ ;  Wheezing'[ ]'$ ; Hemoptysis'[ ]'$ ; Sputum '[ ]'$ ; Snoring '[ ]'$   GI: Vomiting'[ ]'$ ; Dysphagia'[ ]'$ ; Melena'[ ]'$ ; Hematochezia '[ ]'$ ; Heartburn'[ ]'$ ; Abdominal pain '[ ]'$ ; Constipation '[ ]'$ ; Diarrhea '[ ]'$ ; BRBPR '[ ]'$   GU: Hematuria'[ ]'$ ; Dysuria '[ ]'$ ; Nocturia'[ ]'$   Vascular: Pain in legs with walking '[ ]'$ ; Pain in feet with lying flat '[ ]'$ ; Non-healing sores '[ ]'$ ; Stroke '[ ]'$ ; TIA '[ ]'$ ; Slurred speech '[ ]'$ ;  Neuro: Headaches'[ ]'$ ; Vertigo'[ ]'$ ; Seizures'[ ]'$ ; Paresthesias'[ ]'$ ;Blurred vision '[ ]'$ ; Diplopia '[ ]'$ ; Vision changes '[ ]'$   Ortho/Skin: Arthritis [y]; Joint pain [y]; Muscle pain '[ ]'$ ; Joint swelling '[ ]'$ ; Back Pain '[ ]'$ ; Rash '[ ]'$   Psych: Depression'[ ]'$ ; Anxiety[y ]  Heme: Bleeding problems '[ ]'$ ; Clotting disorders '[ ]'$ ; Anemia '[ ]'$   Endocrine: Diabetes '[ ]'$ ; Thyroid dysfunction'[ ]'$    Past Medical History:  Diagnosis Date  . Anxiety   . Cancer [facility])    breast  . Headache     Current Outpatient Medications  Medication Sig Dispense Refill  . acetaminophen (TYLENOL) 500 MG tablet Take 250 mg by mouth every 8 (eight) hours as needed for mild pain or headache.     . Ascorbic Acid (VITAMIN C) 1000 MG tablet Take 1,000 mg by mouth daily.    Marland Kitchen CRANBERRY ULTRA STRENGTH PO Take 8,400 mg by mouth daily.    Marland Kitchen ECHINACEA PO Take 900 mg by mouth 3 (three) times daily as needed (immune support).     No current facility-administered medications for this encounter.  Facility-Administered Medications Ordered in Other Encounters  Medication Dose Route Frequency Provider Last Rate Last Dose  . Chlorhexidine Gluconate Cloth 2 % PADS 6 each  6 each Topical Once Jovita Kussmaul, MD       And  . Chlorhexidine Gluconate Cloth 2 % PADS 6 each  6 each Topical Once Jovita Kussmaul, MD        Allergies  Allergen Reactions  . Amoxicillin Hives    Has patient had a PCN reaction causing immediate rash, facial/tongue/throat swelling, SOB or lightheadedness with hypotension: No Has patient had a PCN reaction causing severe rash involving mucus membranes or skin  necrosis: No Has patient had a PCN reaction that required hospitalization: No Has patient had a PCN reaction occurring within the last 10 years: Yes If all of the above answers are "NO", then may proceed with Cephalosporin use.   . Sulfa Antibiotics Hives      Social History   Socioeconomic History  . Marital status: Married    Spouse name: Not on file  . Number of children: Not on file  . Years of education: Not on file  . Highest education level: Not on file  Social Needs  . Financial resource strain: Not on file  . Food insecurity - worry: Not on file  . Food insecurity - inability: Not on file  . Transportation needs - medical: Not on file  . Transportation needs - non-medical: Not on file  Occupational History  . Not on file  Tobacco Use  . Smoking status: Former Smoker    Years: 3.00    Types: E-cigarettes    Start date: 08/07/2013  Substance and Sexual Activity  . Alcohol use: Yes    Comment: 4-10/wk  . Drug use: No  . Sexual activity: Not on file  Other Topics Concern  . Not on file  Social History Narrative  . Not on file     No family history on file.  No family history of coronary disease.   Vitals:   03/28/17 1241  BP: (!) 156/84  Pulse: 87  SpO2: 99%  Weight: 136 lb (61.7 kg)     PHYSICAL EXAM: General:  Well appearing. No respiratory difficulty HEENT: normal Neck: supple. no JVD. Carotids 2+ bilat; no bruits. No lymphadenopathy or thyromegaly appreciated. Cor: PMI nondisplaced. Regular rate & rhythm. No rubs, gallops or murmurs. Lungs: clear with mildly decreased BS Abdomen: soft, nontender, nondistended. No hepatosplenomegaly. No bruits or masses. Good bowel sounds. Extremities: no cyanosis, clubbing, rash, edema Neuro: alert & oriented x 3, cranial nerves grossly intact. moves all 4 extremities w/o difficulty. Affect pleasant.   ASSESSMENT & PLAN:  1. Breast CA of Lower-Inner quadrant of left breast in female, Estrogen Receptor + -  Explained incidence of Herceptin cardiotoxicity and role of Cardio-oncology clinic at length. Echo images reviewed personally by Dr. Haroldine Laws. All parameters stable. Reviewed signs and symptoms of HF to look for. Ok to begin chemotherapy. Follow-up with echo in 3 months. 2. HTN - Elevated. Will need to follow closely.     Follow-up with echo in 3 months.   Darrick Grinder, NP 03/28/17 12:55 PM   61 y/o smoker with no h/o cardiac disease now with left breast cancer.  Long discussion about the role of chemo and biological therapies. We also discussed risk of reversible risk of cardiotoxicity with herceptin verus adriamycin. Echo reviewed personally and EF and all parameters are normal. Explained incidence of Herceptin cardiotoxicity and role of Cardio-oncology  clinic at length. I am hopeful she will proceed with therapy. We will see her back in 3 months with repeat echo.   Glori Bickers, MD  8:50 PM

## 2017-04-04 ENCOUNTER — Encounter (HOSPITAL_COMMUNITY)
Admission: RE | Disposition: A | Discharge status: Home / Self Care | Payer: Self-pay | Source: Ambulatory Visit | Attending: General Surgery

## 2017-04-04 ENCOUNTER — Encounter (HOSPITAL_COMMUNITY): Payer: Self-pay

## 2017-04-04 ENCOUNTER — Ambulatory Visit (HOSPITAL_COMMUNITY): Payer: BLUE CROSS/BLUE SHIELD | Admitting: Certified Registered Nurse Anesthetist

## 2017-04-04 ENCOUNTER — Ambulatory Visit (HOSPITAL_COMMUNITY)
Admission: RE | Admit: 2017-04-04 | Discharge: 2017-04-04 | Disposition: A | Discharge status: Home / Self Care | Payer: BLUE CROSS/BLUE SHIELD | Source: Ambulatory Visit | Attending: General Surgery | Admitting: General Surgery

## 2017-04-04 ENCOUNTER — Ambulatory Visit (HOSPITAL_COMMUNITY): Payer: BLUE CROSS/BLUE SHIELD

## 2017-04-04 DIAGNOSIS — C50512 Malignant neoplasm of lower-outer quadrant of left female breast: Secondary | ICD-10-CM | POA: Insufficient documentation

## 2017-04-04 DIAGNOSIS — Z87891 Personal history of nicotine dependence: Secondary | ICD-10-CM | POA: Diagnosis not present

## 2017-04-04 DIAGNOSIS — Z170 Estrogen receptor positive status [ER+]: Secondary | ICD-10-CM | POA: Diagnosis not present

## 2017-04-04 DIAGNOSIS — Z419 Encounter for procedure for purposes other than remedying health state, unspecified: Secondary | ICD-10-CM

## 2017-04-04 DIAGNOSIS — R590 Localized enlarged lymph nodes: Secondary | ICD-10-CM | POA: Insufficient documentation

## 2017-04-04 DIAGNOSIS — Z1501 Genetic susceptibility to malignant neoplasm of breast: Secondary | ICD-10-CM | POA: Insufficient documentation

## 2017-04-04 DIAGNOSIS — F419 Anxiety disorder, unspecified: Secondary | ICD-10-CM | POA: Insufficient documentation

## 2017-04-04 DIAGNOSIS — Z95828 Presence of other vascular implants and grafts: Secondary | ICD-10-CM

## 2017-04-04 HISTORY — PX: SIMPLE MASTECTOMY WITH AXILLARY SENTINEL NODE BIOPSY: SHX6098

## 2017-04-04 HISTORY — PX: PORTACATH PLACEMENT: SHX2246

## 2017-04-04 SURGERY — SIMPLE MASTECTOMY WITH AXILLARY SENTINEL NODE BIOPSY
Anesthesia: Regional | Site: Chest

## 2017-04-04 MED ORDER — BUPIVACAINE HCL (PF) 0.25 % IJ SOLN
INTRAMUSCULAR | Status: AC
  Filled 2017-04-04: qty 30

## 2017-04-04 MED ORDER — SODIUM CHLORIDE 0.9 % IV SOLN
INTRAVENOUS | Status: DC | PRN
  Administered 2017-04-04: 09:00:00

## 2017-04-04 MED ORDER — TECHNETIUM TC 99M SULFUR COLLOID FILTERED
1.0000 | Freq: Once | INTRAVENOUS | Status: AC | PRN
  Administered 2017-04-04: 1 via INTRADERMAL

## 2017-04-04 MED ORDER — BUPIVACAINE HCL (PF) 0.25 % IJ SOLN
INTRAMUSCULAR | Status: DC | PRN
  Administered 2017-04-04: 7 mL

## 2017-04-04 MED ORDER — MIDAZOLAM HCL 2 MG/2ML IJ SOLN
INTRAMUSCULAR | Status: AC
  Administered 2017-04-04: 1 mg
  Filled 2017-04-04: qty 2

## 2017-04-04 MED ORDER — PHENYLEPHRINE 40 MCG/ML (10ML) SYRINGE FOR IV PUSH (FOR BLOOD PRESSURE SUPPORT)
PREFILLED_SYRINGE | INTRAVENOUS | Status: AC
  Filled 2017-04-04: qty 10

## 2017-04-04 MED ORDER — METHYLENE BLUE 0.5 % INJ SOLN
INTRAVENOUS | Status: AC
  Filled 2017-04-04: qty 10

## 2017-04-04 MED ORDER — EPHEDRINE SULFATE 50 MG/ML IJ SOLN
INTRAMUSCULAR | Status: DC | PRN
  Administered 2017-04-04: 5 mg via INTRAVENOUS
  Administered 2017-04-04 (×2): 10 mg via INTRAVENOUS

## 2017-04-04 MED ORDER — HEPARIN SOD (PORK) LOCK FLUSH 100 UNIT/ML IV SOLN
INTRAVENOUS | Status: AC
  Filled 2017-04-04: qty 5

## 2017-04-04 MED ORDER — VANCOMYCIN HCL IN DEXTROSE 1-5 GM/200ML-% IV SOLN
INTRAVENOUS | Status: AC
  Filled 2017-04-04: qty 200

## 2017-04-04 MED ORDER — PHENYLEPHRINE HCL 10 MG/ML IJ SOLN
INTRAMUSCULAR | Status: DC | PRN
  Administered 2017-04-04 (×2): 120 ug via INTRAVENOUS

## 2017-04-04 MED ORDER — ROPIVACAINE HCL 7.5 MG/ML IJ SOLN
INTRAMUSCULAR | Status: DC | PRN
  Administered 2017-04-04: 20 mL via PERINEURAL

## 2017-04-04 MED ORDER — GLYCOPYRROLATE 0.2 MG/ML IJ SOLN
INTRAMUSCULAR | Status: DC | PRN
  Administered 2017-04-04: .2 mg via INTRAVENOUS
  Administered 2017-04-04: 0.2 mg via INTRAVENOUS

## 2017-04-04 MED ORDER — HEPARIN SOD (PORK) LOCK FLUSH 100 UNIT/ML IV SOLN
INTRAVENOUS | Status: DC | PRN
  Administered 2017-04-04: 500 [IU] via INTRAVENOUS

## 2017-04-04 MED ORDER — FENTANYL CITRATE (PF) 250 MCG/5ML IJ SOLN
INTRAMUSCULAR | Status: AC
  Filled 2017-04-04: qty 5

## 2017-04-04 MED ORDER — PROPOFOL 10 MG/ML IV BOLUS
INTRAVENOUS | Status: DC | PRN
  Administered 2017-04-04: 20 mg via INTRAVENOUS
  Administered 2017-04-04: 30 mg via INTRAVENOUS
  Administered 2017-04-04: 20 mg via INTRAVENOUS
  Administered 2017-04-04: 180 mg via INTRAVENOUS
  Administered 2017-04-04: 20 mg via INTRAVENOUS

## 2017-04-04 MED ORDER — VANCOMYCIN HCL IN DEXTROSE 1-5 GM/200ML-% IV SOLN
1000.0000 mg | INTRAVENOUS | Status: AC
  Administered 2017-04-04: 1000 mg via INTRAVENOUS

## 2017-04-04 MED ORDER — GABAPENTIN 300 MG PO CAPS
ORAL_CAPSULE | ORAL | Status: AC
  Filled 2017-04-04: qty 1

## 2017-04-04 MED ORDER — GABAPENTIN 300 MG PO CAPS
300.0000 mg | ORAL_CAPSULE | ORAL | Status: AC
  Administered 2017-04-04: 300 mg via ORAL

## 2017-04-04 MED ORDER — FENTANYL CITRATE (PF) 100 MCG/2ML IJ SOLN
INTRAMUSCULAR | Status: AC
  Administered 2017-04-04: 50 ug
  Filled 2017-04-04: qty 2

## 2017-04-04 MED ORDER — 0.9 % SODIUM CHLORIDE (POUR BTL) OPTIME
TOPICAL | Status: DC | PRN
  Administered 2017-04-04: 1000 mL

## 2017-04-04 MED ORDER — MIDAZOLAM HCL 2 MG/2ML IJ SOLN
INTRAMUSCULAR | Status: AC
  Filled 2017-04-04: qty 2

## 2017-04-04 MED ORDER — DEXAMETHASONE SODIUM PHOSPHATE 4 MG/ML IJ SOLN
INTRAMUSCULAR | Status: DC | PRN
  Administered 2017-04-04: 10 mg via INTRAVENOUS

## 2017-04-04 MED ORDER — PROPOFOL 10 MG/ML IV BOLUS
INTRAVENOUS | Status: AC
  Filled 2017-04-04: qty 20

## 2017-04-04 MED ORDER — EPHEDRINE 5 MG/ML INJ
INTRAVENOUS | Status: AC
  Filled 2017-04-04: qty 10

## 2017-04-04 MED ORDER — HYDROCODONE-ACETAMINOPHEN 5-325 MG PO TABS: 1.0000 | ORAL_TABLET | Freq: Four times a day (QID) | ORAL | 0 refills | Status: DC | PRN

## 2017-04-04 MED ORDER — DEXTROSE 5 % IV SOLN
INTRAVENOUS | Status: DC | PRN
  Administered 2017-04-04: 50 ug/min via INTRAVENOUS

## 2017-04-04 MED ORDER — ONDANSETRON HCL 4 MG/2ML IJ SOLN: 4.0000 mg | Freq: Once | INTRAMUSCULAR | Status: DC | PRN

## 2017-04-04 MED ORDER — ACETAMINOPHEN 500 MG PO TABS
1000.0000 mg | ORAL_TABLET | ORAL | Status: AC
  Administered 2017-04-04: 1000 mg via ORAL

## 2017-04-04 MED ORDER — FENTANYL CITRATE (PF) 250 MCG/5ML IJ SOLN
INTRAMUSCULAR | Status: DC | PRN
  Administered 2017-04-04: 50 ug via INTRAVENOUS
  Administered 2017-04-04: 25 ug via INTRAVENOUS
  Administered 2017-04-04: 50 ug via INTRAVENOUS

## 2017-04-04 MED ORDER — ACETAMINOPHEN 500 MG PO TABS
ORAL_TABLET | ORAL | Status: DC
Start: 2017-04-04 — End: 2017-04-04
  Filled 2017-04-04: qty 2

## 2017-04-04 MED ORDER — LACTATED RINGERS IV SOLN
INTRAVENOUS | Status: DC
  Administered 2017-04-04 (×3): via INTRAVENOUS

## 2017-04-04 MED ORDER — FENTANYL CITRATE (PF) 100 MCG/2ML IJ SOLN: 25.0000 ug | INTRAMUSCULAR | Status: DC | PRN

## 2017-04-04 SURGICAL SUPPLY — 72 items
APPLIER CLIP 9.375 MED OPEN (MISCELLANEOUS) ×4
BAG DECANTER FOR FLEXI CONT (MISCELLANEOUS) ×4 IMPLANT
BINDER BREAST LRG (GAUZE/BANDAGES/DRESSINGS) IMPLANT
BINDER BREAST XLRG (GAUZE/BANDAGES/DRESSINGS) ×4 IMPLANT
BLADE SURG 15 STRL LF DISP TIS (BLADE) ×2 IMPLANT
BLADE SURG 15 STRL SS (BLADE) ×2
CANISTER SUCT 3000ML PPV (MISCELLANEOUS) ×4 IMPLANT
CHLORAPREP W/TINT 26ML (MISCELLANEOUS) ×4 IMPLANT
CLIP APPLIE 9.375 MED OPEN (MISCELLANEOUS) ×2 IMPLANT
CONT SPEC 4OZ CLIKSEAL STRL BL (MISCELLANEOUS) ×4 IMPLANT
COVER PROBE W GEL 5X96 (DRAPES) ×4 IMPLANT
COVER SURGICAL LIGHT HANDLE (MISCELLANEOUS) ×4 IMPLANT
COVER TRANSDUCER ULTRASND GEL (DRAPE) IMPLANT
CRADLE DONUT ADULT HEAD (MISCELLANEOUS) ×4 IMPLANT
DERMABOND ADVANCED (GAUZE/BANDAGES/DRESSINGS) ×4
DERMABOND ADVANCED .7 DNX12 (GAUZE/BANDAGES/DRESSINGS) ×4 IMPLANT
DEVICE DISSECT PLASMABLAD 3.0S (MISCELLANEOUS) IMPLANT
DRAIN CHANNEL 19F RND (DRAIN) ×8 IMPLANT
DRAPE C-ARM 42X72 X-RAY (DRAPES) ×4 IMPLANT
DRAPE CHEST BREAST 15X10 FENES (DRAPES) ×8 IMPLANT
DRAPE UTILITY XL STRL (DRAPES) ×8 IMPLANT
DRSG PAD ABDOMINAL 8X10 ST (GAUZE/BANDAGES/DRESSINGS) ×8 IMPLANT
ELECT CAUTERY BLADE 6.4 (BLADE) ×4 IMPLANT
ELECT REM PT RETURN 9FT ADLT (ELECTROSURGICAL) ×4
ELECTRODE REM PT RTRN 9FT ADLT (ELECTROSURGICAL) ×2 IMPLANT
EVACUATOR SILICONE 100CC (DRAIN) ×8 IMPLANT
GAUZE SPONGE 4X4 12PLY STRL (GAUZE/BANDAGES/DRESSINGS) IMPLANT
GAUZE SPONGE 4X4 12PLY STRL LF (GAUZE/BANDAGES/DRESSINGS) ×4 IMPLANT
GAUZE SPONGE 4X4 16PLY XRAY LF (GAUZE/BANDAGES/DRESSINGS) ×4 IMPLANT
GEL ULTRASOUND 20GR AQUASONIC (MISCELLANEOUS) IMPLANT
GLOVE BIO SURGEON STRL SZ7.5 (GLOVE) ×4 IMPLANT
GOWN STRL REUS W/ TWL LRG LVL3 (GOWN DISPOSABLE) ×8 IMPLANT
GOWN STRL REUS W/TWL LRG LVL3 (GOWN DISPOSABLE) ×8
INTRODUCER COOK 11FR (CATHETERS) IMPLANT
KIT BASIN OR (CUSTOM PROCEDURE TRAY) ×4 IMPLANT
KIT PORT POWER 8FR ISP CVUE (Miscellaneous) ×4 IMPLANT
KIT ROOM TURNOVER OR (KITS) ×4 IMPLANT
LIGHT WAVEGUIDE WIDE FLAT (MISCELLANEOUS) IMPLANT
NEEDLE 18GX1X1/2 (RX/OR ONLY) (NEEDLE) IMPLANT
NEEDLE 22X1 1/2 (OR ONLY) (NEEDLE) IMPLANT
NEEDLE FILTER BLUNT 18X 1/2SAF (NEEDLE)
NEEDLE FILTER BLUNT 18X1 1/2 (NEEDLE) IMPLANT
NEEDLE HYPO 25GX1X1/2 BEV (NEEDLE) ×4 IMPLANT
NS IRRIG 1000ML POUR BTL (IV SOLUTION) ×4 IMPLANT
PACK GENERAL/GYN (CUSTOM PROCEDURE TRAY) ×4 IMPLANT
PACK SURGICAL SETUP 50X90 (CUSTOM PROCEDURE TRAY) ×4 IMPLANT
PAD ARMBOARD 7.5X6 YLW CONV (MISCELLANEOUS) ×4 IMPLANT
PENCIL BUTTON HOLSTER BLD 10FT (ELECTRODE) ×4 IMPLANT
PLASMABLADE 3.0S (MISCELLANEOUS)
SET INTRODUCER 12FR PACEMAKER (SHEATH) IMPLANT
SET SHEATH INTRODUCER 10FR (MISCELLANEOUS) IMPLANT
SHEATH COOK PEEL AWAY SET 9F (SHEATH) IMPLANT
SPECIMEN JAR X LARGE (MISCELLANEOUS) ×4 IMPLANT
SUT ETHILON 3 0 FSL (SUTURE) ×4 IMPLANT
SUT MNCRL AB 4-0 PS2 18 (SUTURE) ×12 IMPLANT
SUT PROLENE 2 0 SH 30 (SUTURE) ×8 IMPLANT
SUT SILK 2 0 (SUTURE)
SUT SILK 2-0 18XBRD TIE 12 (SUTURE) IMPLANT
SUT VIC AB 3-0 54X BRD REEL (SUTURE) ×2 IMPLANT
SUT VIC AB 3-0 BRD 54 (SUTURE) ×2
SUT VIC AB 3-0 SH 18 (SUTURE) ×4 IMPLANT
SUT VIC AB 3-0 SH 27 (SUTURE) ×2
SUT VIC AB 3-0 SH 27XBRD (SUTURE) ×2 IMPLANT
SYR 10ML LL (SYRINGE) IMPLANT
SYR 20ML ECCENTRIC (SYRINGE) ×8 IMPLANT
SYR 5ML LUER SLIP (SYRINGE) ×4 IMPLANT
SYR CONTROL 10ML LL (SYRINGE) ×4 IMPLANT
TOWEL OR 17X24 6PK STRL BLUE (TOWEL DISPOSABLE) ×4 IMPLANT
TOWEL OR 17X26 10 PK STRL BLUE (TOWEL DISPOSABLE) ×4 IMPLANT
TRAY INTRODUCTER 8FR IAK S8IT (SET/KITS/TRAYS/PACK) ×4 IMPLANT
TUBE CONNECTING 12'X1/4 (SUCTIONS)
TUBE CONNECTING 12X1/4 (SUCTIONS) IMPLANT

## 2017-04-04 NOTE — Interval H&P Note (Signed)
History and Physical Interval Note:  04/04/2017 8:28 AM  Adriana Jones  has presented today for surgery, with the diagnosis of LEFT BREAST CANCER  The various methods of treatment have been discussed with the patient and family. After consideration of risks, benefits and other options for treatment, the patient has consented to  Procedure(s): LEFT MASTECTOMY WITH LEFT SENTINEL LYMPH NODE BIOPSY (Left) INSERTION PORT-A-CATH (N/A) as a surgical intervention .  The patient's history has been reviewed, patient examined, no change in status, stable for surgery.  I have reviewed the patient's chart and labs.  Questions were answered to the patient's satisfaction.     TOTH III,Bakary Bramer S

## 2017-04-04 NOTE — Transfer of Care (Signed)
Immediate Anesthesia Transfer of Care Note  Patient: Adriana Jones  Procedure(s) Performed: LEFT MASTECTOMY WITH LEFT SENTINEL LYMPH NODE BIOPSY (Left Breast) INSERTION PORT-A-CATH (N/A Chest)  Patient Location: PACU  Anesthesia Type:General  Level of Consciousness: patient cooperative and responds to stimulation  Airway & Oxygen Therapy: Patient Spontanous Breathing and Patient connected to face mask oxygen  Post-op Assessment: Report given to RN and Post -op Vital signs reviewed and stable  Post vital signs: Reviewed and stable  Last Vitals:  Vitals:   04/04/17 0638 04/04/17 0639  BP: (!) 181/86   Pulse:  75  Resp: 18   Temp: 36.7 C   SpO2: 98%     Last Pain:  Vitals:   04/04/17 0638  TempSrc: Oral      Patients Stated Pain Goal: 3 (74/16/38 4536)  Complications: No apparent anesthesia complications

## 2017-04-04 NOTE — Op Note (Signed)
04/04/2017  11:27 AM  PATIENT:  Adriana Jones  61 y.o. female  PRE-OPERATIVE DIAGNOSIS:  LEFT BREAST CANCER  POST-OPERATIVE DIAGNOSIS:  LEFT BREAST CANCER  PROCEDURE:  Procedure(s): LEFT MASTECTOMY WITH DEEP LEFT AXILLARY SENTINEL LYMPH NODE BIOPSY AND LYMPH NODE DISSECTION INSERTION PORT-A-CATH (N/A)  SURGEON:  Surgeon(s) and Role:    * Jovita Kussmaul, MD - Primary  PHYSICIAN ASSISTANT:   ASSISTANTS: Judyann Munson, RNFA   ANESTHESIA:   general  EBL:  25 mL   BLOOD ADMINISTERED:none  DRAINS: (2) Jackson-Pratt drain(s) with closed bulb suction in the prepectoral space   LOCAL MEDICATIONS USED:  MARCAINE     SPECIMEN:  Source of Specimen:  left mastectomy and axillary lymph nodes  DISPOSITION OF SPECIMEN:  PATHOLOGY  COUNTS:  YES  TOURNIQUET:  * No tourniquets in log *  DICTATION: .Dragon Dictation   After informed consent was obtained the patient was brought to the operating room and placed in the supine position on the operating room table. After adequate induction of general anesthesia a roll was placed behind the patient's shoulder blades to extend the shoulder slightly. The right chest was prepped with ChloraPrep, allowed to dry, and draped in usual sterile manner. An appropriate timeout was performed. The patient was placed in Trendelenburg position. The area lateral to the bend of the clavicle and the right chest was infiltrated with quarter percent Marcaine. A large bore needle from the Port-A-Cath kit was used to slide beneath the bend of the clavicle leading towards the sternal notch and in doing so I was able to access the right subclavian vein without difficulty. A wire was fed through the needle using the Seldinger technique without difficulty. The wire was confirmed in the central venous system using real-time fluoroscopy. Next a small incision was made on the right chest wall with a 15 blade knife. The incision was carried through the skin and subcutaneous tissue  sharply with electrocautery. A subcutaneous pocket was created inferior to the incision using blunt finger dissection. Next the tubing was placed on the reservoir. The reservoir was placed in the pocket and the length of the tubing was again estimated using real-time fluoroscopy and cut to the appropriate length. Next a sheath and dilator were fed over the wire using the Seldinger technique without difficulty. The dilator and wire were then removed. The tubing was fed through the sheath as far as it would go and then the tubing was held in place while the sheath was gently cracked and separated. Another real-time fluoroscopy image showed the tip of the catheter to be in the distal superior vena cava. The tubing was then permanently anchored to the reservoir. The reservoir was anchored in the pocket with 2 2-0 Prolene stitches. The port was then aspirated and it aspirated blood easily. The port was then flushed initially with a dilute heparin solution and then with a more concentrated heparin solution. The subcutaneous tissue was closed over the port with interrupted 3-0 Vicryl stitches. The skin was closed with a running 4-0 Monocryl subcuticular stitch. Dermabond dressings were applied. At this point the drapes were removed. The arms were placed out in the left chest, breast, and axillary area were prepped with ChloraPrep, allowed to dry, and draped in usual sterile manner. An appropriate timeout was performed. Earlier in the day the patient underwent injection of 1 mCi of technetium sulfur colloid in the subareolar position on the left. The neoprobe was sent to technetium and an area of radioactivity was readily  identified in the left axilla. Next an elliptical incision was made with a 10 blade knife around the nipple and areola complex. The inferior incision had to be brought all the way to the inframammary fold because of the superficial nature of the tumor along the inferior edge of the breast. The incision was  then carried through the skin and subcutaneous tissue sharply with the plasma blade. Breast hooks were used to elevate the skin flaps anteriorly towards the ceiling. Thin skin flaps were created circumferentially between the breast tissue in the subcutaneous fat. This dissection was carried all the way to the chest wall sharply with the plasma blade. Next the breast was removed from the pectoralis muscle with the pectoralis fascia. A perforating vessel medially was controlled with a 3-0 Vicryl figure-of-eight stitch. Once the breast was removed it was oriented with a stitch on the lateral skin and sent to pathology. Using the neoprobe I was able to identify several hot firm enlarged lymph nodes. These lymph nodes went all the way up to the left subclavian vein. Because of this I essentially did a lymph node dissection. The subclavian vein superiorly, the latissimus muscle laterally, and the serratus muscle medially were all identified. The lymph node tissue within these boundaries was removed by combination of sharp dissection with the plasma blade and blunt right angle dissection. The long thoracic and thoracodorsal neurovascular complexes were identified and spared. 4 sentinel nodes were sent along with the left axillary contents. Hemostasis was achieved using the plasma blade. The wound was irrigated with copious amounts of saline. The operative bed was examined and found to be hemostatic. 2 small stab incisions were made near the anterior axillary line inferior to the operative bed. A tonsil clamp was used to bring [address] [place] [address] drains into the operative bed. The drain laterally was placed in the axilla and the medial drain was placed along the chest wall. The drains were anchored to the skin with 3-0 nylon stitches. The superior and inferior skin flaps were then grossly reapproximated with interrupted 3-0 Vicryl stitches. The skin was then closed with running 4-0 Monocryl subcuticular stitch. Dermabond  dressings were applied. The patient tolerated the procedure well. At the end of the case all needle sponges counts were correct. The patient was then awakened and taken to recovery in stable condition. The skin flaps appeared healthy and viable at the end of the case.  PLAN OF CARE: Discharge to home after PACU  PATIENT DISPOSITION:  PACU - hemodynamically stable.   Delay start of Pharmacological VTE agent (>24hrs) due to surgical blood loss or risk of bleeding: not applicable

## 2017-04-04 NOTE — Anesthesia Preprocedure Evaluation (Addendum)
Anesthesia Evaluation  Patient identified by MRN, date of birth, ID band Patient awake    Reviewed: Allergy & Precautions, NPO status , Patient's Chart, lab work & pertinent test results  Airway Mallampati: II  TM Distance: >3 FB Neck ROM: Full    Dental  (+) Edentulous Upper, Edentulous Lower   Pulmonary former smoker,    Pulmonary exam normal breath sounds clear to auscultation       Cardiovascular negative cardio ROS Normal cardiovascular exam Rhythm:Regular Rate:Normal  ECHO: LV EF: 60% -   65%   Neuro/Psych  Headaches, Anxiety    GI/Hepatic negative GI ROS, Neg liver ROS,   Endo/Other  negative endocrine ROS  Renal/GU negative Renal ROS     Musculoskeletal negative musculoskeletal ROS (+)   Abdominal   Peds  Hematology negative hematology ROS (+)   Anesthesia Other Findings LEFT BREAST CANCER  Reproductive/Obstetrics                            Anesthesia Physical Anesthesia Plan  ASA: II  Anesthesia Plan: General and Regional   Post-op Pain Management: GA combined w/ Regional for post-op pain   Induction: Intravenous  PONV Risk Score and Plan: 3 and Ondansetron, Dexamethasone and Midazolam  Airway Management Planned: LMA  Additional Equipment:   Intra-op Plan:   Post-operative Plan: Extubation in OR  Informed Consent: I have reviewed the patients History and Physical, chart, labs and discussed the procedure including the risks, benefits and alternatives for the proposed anesthesia with the patient or authorized representative who has indicated his/her understanding and acceptance.   Dental advisory given  Plan Discussed with: CRNA  Anesthesia Plan Comments:         Anesthesia Quick Evaluation

## 2017-04-04 NOTE — H&P (Signed)
Adriana Jones  Location: [facility] Surgery Patient #: [id number] DOB: 1955-09-29 Undefined / Language: Cleophus Molt / Race: White Female   History of Present Illness The patient is a 61 year old female who presents with breast cancer. We are asked to see the patient in consultation by Dr. Isidore Moos to evaluate her for a new left breast cancer. The patient is a 61 year old white female who presents with a palpable mass in the inferior left breast. She is not sure how long it was there. she denies any pain or discharge from the nipple. On imaging there were 2 masses in the inferior left breast measuring 1.6cm and 2.8cm as well as some calcs in the upper and inner left breast that were not biopsied. 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 She had 2 enlarged lymph nodes one of which was biopsied and was neg. She does smoke.   Past Surgical History  Breast Biopsy  Left. Oral Surgery   Diagnostic Studies History  Colonoscopy  never Mammogram  within last year Pap Smear  >5 years ago  Medication History  Medications Reconciled  Social History Alcohol use  Moderate alcohol use. Caffeine use  Coffee, Tea. No drug use  Tobacco use  Current some day smoker.  Family History  Hypertension  Mother. Seizure disorder  Brother.  Pregnancy / Birth History  Age at menarche  23 years. Age of menopause  35-50 Contraceptive History  Oral contraceptives. Gravida  1 Length (months) of breastfeeding  7-12 Maternal age  78-30 Para  1  Other Problems  Bladder Problems  Lump In Breast     Review of Systems General Present- Appetite Loss and Fatigue. Not Present- Chills, Fever, Night Sweats, Weight Gain and Weight Loss. Skin Not Present- Change in Wart/Mole, Dryness, Hives, Jaundice, New Lesions, Non-Healing Wounds, Rash and Ulcer. HEENT Present- Hearing Loss, Ringing in the Ears and Wears glasses/contact lenses. Not  Present- Earache, Hoarseness, Nose Bleed, Oral Ulcers, Seasonal Allergies, Sinus Pain, Sore Throat, Visual Disturbances and Yellow Eyes. Breast Present- Breast Mass. Not Present- Breast Pain, Nipple Discharge and Skin Changes. Cardiovascular Present- Leg Cramps and Palpitations. Not Present- Chest Pain, Difficulty Breathing Lying Down, Rapid Heart Rate, Shortness of Breath and Swelling of Extremities. Gastrointestinal Present- Bloating. Not Present- Abdominal Pain, Bloody Stool, Change in Bowel Habits, Chronic diarrhea, Constipation, Difficulty Swallowing, Excessive gas, Gets full quickly at meals, Hemorrhoids, Indigestion, Nausea, Rectal Pain and Vomiting. Female Genitourinary Present- Frequency. Not Present- Nocturia, Painful Urination, Pelvic Pain and Urgency. Musculoskeletal Not Present- Back Pain, Joint Pain, Joint Stiffness, Muscle Pain, Muscle Weakness and Swelling of Extremities. Neurological Present- Headaches. Not Present- Decreased Memory, Fainting, Numbness, Seizures, Tingling, Tremor, Trouble walking and Weakness. Psychiatric Present- Anxiety and Depression. Not Present- Bipolar, Change in Sleep Pattern, Fearful and Frequent crying. Endocrine Present- Hot flashes. Not Present- Cold Intolerance, Excessive Hunger, Hair Changes, Heat Intolerance and New Diabetes. Hematology Present- Easy Bruising. Not Present- Blood Thinners, Excessive bleeding, Gland problems, HIV and Persistent Infections.   Physical Exam  General Mental Status-Alert. General Appearance-Consistent with stated age. Hydration-Well hydrated. Voice-Normal.  Head and Neck Head-normocephalic, atraumatic with no lesions or palpable masses. Trachea-midline. Thyroid Gland Characteristics - normal size and consistency.  Eye Eyeball - Bilateral-Extraocular movements intact. Sclera/Conjunctiva - Bilateral-No scleral icterus.  Chest and Lung Exam Chest and lung exam reveals -quiet, even and easy  respiratory effort with no use of accessory muscles and on auscultation, normal  breath sounds, no adventitious sounds and normal vocal resonance. Inspection Chest Wall - Normal. Back - normal.  Breast Note: there is a 2cm palpable mass in the inferior left breast. it does not appear tethered to the chest wall but does pucker the skin. There is no palpable axillary, supraclavicular, or cervical lymphadenopathy   Cardiovascular Cardiovascular examination reveals -normal heart sounds, regular rate and rhythm with no murmurs and normal pedal pulses bilaterally.  Abdomen Inspection Inspection of the abdomen reveals - No Hernias. Skin - Scar - no surgical scars. Palpation/Percussion Palpation and Percussion of the abdomen reveal - Soft, Non Tender, No Rebound tenderness, No Rigidity (guarding) and No hepatosplenomegaly. Auscultation Auscultation of the abdomen reveals - Bowel sounds normal.  Neurologic Neurologic evaluation reveals -alert and oriented x 3 with no impairment of recent or remote memory. Mental Status-Normal.  Musculoskeletal Normal Exam - Left-Upper Extremity Strength Normal and Lower Extremity Strength Normal. Normal Exam - Right-Upper Extremity Strength Normal and Lower Extremity Strength Normal.  Lymphatic Head & Neck  General Head & Neck Lymphatics: Bilateral - Description - Normal. Axillary  General Axillary Region: Bilateral - Description - Normal. Tenderness - Non Tender. Femoral & Inguinal  Generalized Femoral & Inguinal Lymphatics: Bilateral - Description - Normal. Tenderness - Non Tender.    Assessment & Plan MALIGNANT NEOPLASM OF LOWER-OUTER QUADRANT OF LEFT BREAST OF FEMALE, ESTROGEN RECEPTOR POSITIVE (C50.512) Impression: The patient appears to have a large area of cancer involving the lower left breast. I have talked to her in detail about the different options for treatment and at this point we favor left mastectomy with sentinel node  mapping. Since she is HER-2 positive she will also benefit from adjuvant chemotherapy and will need a Port-A-Cath. I have discussed with her in detail the risks and benefits of the operation as well as some of the technical aspects and she understands and wishes to proceed Current Plans Pt Education - Breast cancer: discussed with patient and provided information.

## 2017-04-04 NOTE — Progress Notes (Signed)
Call to Dr. Roanna Banning for anesthesia attention.

## 2017-04-04 NOTE — Anesthesia Procedure Notes (Signed)
Procedure Name: LMA Insertion Date/Time: 04/04/2017 9:13 AM Performed by: Glynda Jaeger, CRNA Pre-anesthesia Checklist: Patient identified, Emergency Drugs available, Suction available, Patient being monitored and Timeout performed Patient Re-evaluated:Patient Re-evaluated prior to induction Oxygen Delivery Method: Circle System Utilized Preoxygenation: Pre-oxygenation with 100% oxygen Induction Type: IV induction Ventilation: Mask ventilation without difficulty LMA: LMA inserted LMA Size: 4.0 Number of attempts: 1 Placement Confirmation: positive ETCO2 Tube secured with: Tape Dental Injury: Teeth and Oropharynx as per pre-operative assessment

## 2017-04-04 NOTE — Anesthesia Procedure Notes (Signed)
Anesthesia Regional Block: Pectoralis block   Pre-Anesthetic Checklist: ,, timeout performed, Correct Patient, Correct Site, Correct Laterality, Correct Procedure,, site marked, risks and benefits discussed, Surgical consent,  Pre-op evaluation,  At surgeon's request and post-op pain management  Laterality: Left  Prep: chloraprep       Needles:  Injection technique: Single-shot  Needle Type: Echogenic Stimulator Needle     Needle Length: 9cm  Needle Gauge: 21     Additional Needles:   Procedures:, nerve stimulator,,, ultrasound used (permanent image in chart), intact distal pulses,,,  Narrative:  Start time: 04/04/2017 8:20 AM End time: 04/04/2017 8:30 AM Injection made incrementally with aspirations every 5 mL.  Performed by: Personally  Anesthesiologist: Murvin Natal, MD  Additional Notes: Functioning IV was confirmed and monitors were applied.  A 13mm 21ga Arrow echogenic stimulator needle was used. Sterile prep, hand hygiene and sterile gloves were used.  Negative aspiration and negative test dose prior to incremental administration of local anesthetic. The patient tolerated the procedure well.

## 2017-04-05 ENCOUNTER — Encounter (HOSPITAL_COMMUNITY): Payer: Self-pay | Admitting: General Surgery

## 2017-04-05 NOTE — Anesthesia Postprocedure Evaluation (Signed)
Anesthesia Post Note  Patient: Adriana Jones  Procedure(s) Performed: LEFT MASTECTOMY WITH LEFT SENTINEL LYMPH NODE BIOPSY (Left Breast) INSERTION PORT-A-CATH (N/A Chest)     Patient location during evaluation: PACU Anesthesia Type: Regional and General Level of consciousness: awake and alert Pain management: pain level controlled Vital Signs Assessment: post-procedure vital signs reviewed and stable Respiratory status: spontaneous breathing, nonlabored ventilation, respiratory function stable and patient connected to nasal cannula oxygen Cardiovascular status: blood pressure returned to baseline and stable Postop Assessment: no apparent nausea or vomiting Anesthetic complications: no    Last Vitals:  Vitals:   04/04/17 1215 04/04/17 1217  BP:  129/74  Pulse: 75 80  Resp: 12 13  Temp: 36.8 C   SpO2: 96% 96%    Last Pain:  Vitals:   04/04/17 1217  TempSrc:   PainSc: 3                  Ryan P Ellender

## 2017-04-11 ENCOUNTER — Ambulatory Visit (HOSPITAL_BASED_OUTPATIENT_CLINIC_OR_DEPARTMENT_OTHER): Payer: BLUE CROSS/BLUE SHIELD | Admitting: Hematology and Oncology

## 2017-04-11 ENCOUNTER — Other Ambulatory Visit: Payer: BLUE CROSS/BLUE SHIELD

## 2017-04-11 DIAGNOSIS — C50312 Malignant neoplasm of lower-inner quadrant of left female breast: Secondary | ICD-10-CM

## 2017-04-11 DIAGNOSIS — Z170 Estrogen receptor positive status [ER+]: Secondary | ICD-10-CM | POA: Diagnosis not present

## 2017-04-11 NOTE — Assessment & Plan Note (Addendum)
04/04/2017: Left mastectomy: IDC grade 3, 3.8 cm, intermediate grade DCIS, invasive cancer comes 0.1 cm to posterior margin and less than 0.1 cm to anterior inferior margin at 0/13 lymph nodes, ER 100%, PR 0%, HER-2 positive ratio 2.08 T2 N0 stage IIb  Pathology counseling: I discussed the final pathology report of the patient provided  a copy of this report. I discussed the margins as well as lymph node surgeries. We also discussed the final staging along with previously performed ER/PR and HER-2/neu testing.  Treatment plan: 1. Adjuvant chemotherapy with TCH followed by Herceptin maintenance for 1 year. 2. Adjuvant radiation therapy if necessary based upon final pathology 3. Adjuvant antiestrogen therapy  Return to clinic in 3 weeks to start chemotherapy

## 2017-04-11 NOTE — Progress Notes (Signed)
Patient Care Team: Molli Posey, MD as PCP - General (Obstetrics and Gynecology)  DIAGNOSIS:  Encounter Diagnosis  Name Primary?  . Malignant neoplasm of lower-inner quadrant of left breast in female, estrogen receptor positive ([place])     SUMMARY OF ONCOLOGIC HISTORY:   Malignant neoplasm of lower-inner quadrant of left breast in female, estrogen receptor positive ([place])   02/28/2017 Initial Diagnosis    Palpable left breast masses 2.5 cm at 7:00 and another 2.5 cm at 6:00 1.9 cm apart biopsy: IDC grade 2 with DCISER 100%, PR 0%, Ki-67 20%, HER-2 positiveratio 2.08, axilla negative; in addition indeterminate calcifications medially and laterally,T2 N0 stage 2A clinical stage AJCC 8      04/04/2017 Surgery    Left mastectomy: IDC grade 3, 3.8 cm, intermediate grade DCIS, invasive cancer comes 0.1 cm to posterior margin and less than 0.1 cm to anterior inferior margin at 0/13 lymph nodes, ER 100%, PR 0%, HER-2 positive ratio 2.08 T2 N0 stage IIb       CHIEF COMPLIANT: Patient is here to discuss adjuvant treatment options  INTERVAL HISTORY: Adriana Jones is a 61 year old with the above-mentioned history of left breast cancer treated with mastectomy and is here today to discuss the results of the pathology.  She appears to be extremely irritable and angry today.  She wanted to know the results of the pathology and appeared to be extremely upset.  She still has drains which are likely due to wound later this week.  REVIEW OF SYSTEMS:   Constitutional: Denies fevers, chills or abnormal weight loss Eyes: Denies blurriness of vision Ears, nose, mouth, throat, and face: Denies mucositis or sore throat Respiratory: Denies cough, dyspnea or wheezes Cardiovascular: Denies palpitation, chest discomfort Gastrointestinal:  Denies nausea, heartburn or change in bowel habits Skin: Denies abnormal skin rashes Lymphatics: Denies new lymphadenopathy or easy bruising Neurological:Denies numbness,  tingling or new weaknesses Behavioral/Psych: Mood is stable, no new changes  Extremities: No lower extremity edema Breast: Left mastectomy All other systems were reviewed with the patient and are negative.  I have reviewed the past medical history, past surgical history, social history and family history with the patient and they are unchanged from previous note.  ALLERGIES:  is allergic to amoxicillin and sulfa antibiotics.  MEDICATIONS:  Current Outpatient Medications  Medication Sig Dispense Refill  . acetaminophen (TYLENOL) 500 MG tablet Take 250 mg by mouth every 8 (eight) hours as needed for mild pain or headache.     . Ascorbic Acid (VITAMIN C) 1000 MG tablet Take 1,000 mg by mouth daily.    Marland Kitchen CRANBERRY ULTRA STRENGTH PO Take 8,400 mg by mouth daily.    Marland Kitchen ECHINACEA PO Take 900 mg by mouth 3 (three) times daily as needed (immune support).    Marland Kitchen HYDROcodone-acetaminophen (NORCO/VICODIN) 5-325 MG tablet Take 1-2 tablets every 6 (six) hours as needed by mouth for moderate pain or severe pain. 20 tablet 0   No current facility-administered medications for this visit.     PHYSICAL EXAMINATION: ECOG PERFORMANCE STATUS: 1 - Symptomatic but completely ambulatory  Vitals:   04/11/17 1148  BP: (!) 162/90  Pulse: (!) 102  Resp: 20  Temp: 97.7 F (36.5 C)  SpO2: 98%   Filed Weights   04/11/17 1148  Weight: 139 lb 3.2 oz (63.1 kg)    GENERAL:alert, no distress and comfortable SKIN: skin color, texture, turgor are normal, no rashes or significant lesions EYES: normal, Conjunctiva are pink and non-injected, sclera clear OROPHARYNX:no exudate,  no erythema and lips, buccal mucosa, and tongue normal  NECK: supple, thyroid normal size, non-tender, without nodularity LYMPH:  no palpable lymphadenopathy in the cervical, axillary or inguinal LUNGS: clear to auscultation and percussion with normal breathing effort HEART: regular rate & rhythm and no murmurs and no lower extremity  edema ABDOMEN:abdomen soft, non-tender and normal bowel sounds MUSCULOSKELETAL:no cyanosis of digits and no clubbing  NEURO: alert & oriented x 3 with fluent speech, no focal motor/sensory deficits EXTREMITIES: No lower extremity edema BREAST: Left mastectomy. (exam performed in the presence of a chaperone)  LABORATORY DATA:  I have reviewed the data as listed   Chemistry      Component Value Date/Time   NA 139 03/28/2017 0948   NA 143 03/09/2017 1229   K 4.3 03/28/2017 0948   K 4.2 03/09/2017 1229   CL 109 03/28/2017 0948   CO2 23 03/28/2017 0948   CO2 26 03/09/2017 1229   BUN 10 03/28/2017 0948   BUN 8.3 03/09/2017 1229   CREATININE 0.58 03/28/2017 0948   CREATININE 0.7 03/09/2017 1229      Component Value Date/Time   CALCIUM 9.3 03/28/2017 0948   CALCIUM 9.3 03/09/2017 1229   ALKPHOS 78 03/09/2017 1229   AST 15 03/09/2017 1229   ALT 13 03/09/2017 1229   BILITOT 0.37 03/09/2017 1229       Lab Results  Component Value Date   WBC 4.9 03/28/2017   HGB 13.4 03/28/2017   HCT 41.1 03/28/2017   MCV 92.4 03/28/2017   PLT 219 03/28/2017   NEUTROABS 3.7 03/09/2017    ASSESSMENT & PLAN:  Malignant neoplasm of lower-inner quadrant of left breast in female, estrogen receptor positive ([place]) 04/04/2017: Left mastectomy: IDC grade 3, 3.8 cm, intermediate grade DCIS, invasive cancer comes 0.1 cm to posterior margin and less than 0.1 cm to anterior inferior margin at 0/13 lymph nodes, ER 100%, PR 0%, HER-2 positive ratio 2.08 T2 N0 stage IIb  Pathology counseling: I discussed the final pathology report of the patient provided  a copy of this report. I discussed the margins as well as lymph node surgeries. We also discussed the final staging along with previously performed ER/PR and HER-2/neu testing.  Treatment plan: 1. Adjuvant chemotherapy with TCH followed by Herceptin maintenance for 1 year. 2. Adjuvant radiation therapy if necessary based upon final pathology 3. Adjuvant  antiestrogen therapy  After much discussion, patient tells me that she is not keen on receiving chemotherapy without marijuana. She is extremely worried about toxicities of antinausea medications. She thinks that her body will not be able to tolerate the start of chemotherapy.  After lengthy discussion, I also discussed with her about the option of Taxol Herceptin. Patient will think about it and we will call her next Monday to finalize the treatment plan.  If she is agreeable, return to clinic in 3 weeks to start chemotherapy   I spent 25 minutes talking to the patient of which more than half was spent in counseling and coordination of care.  No orders of the defined types were placed in this encounter.  The patient has a good understanding of the overall plan. she agrees with it. she will call with any problems that may develop before the next visit here.   Rulon Eisenmenger, MD 04/11/17

## 2017-04-19 ENCOUNTER — Telehealth: Payer: Self-pay | Admitting: *Deleted

## 2017-04-19 NOTE — Telephone Encounter (Signed)
Called pt to see if she has made a decision regarding chemotherapy. Pt relate she has questions regarding efficacy between Taxol and Taxatere. Informed pt that I will relay questions to Dr. Lindi Adie.

## 2017-04-26 ENCOUNTER — Telehealth: Payer: Self-pay | Admitting: *Deleted

## 2017-04-26 ENCOUNTER — Other Ambulatory Visit: Payer: Self-pay | Admitting: Hematology and Oncology

## 2017-04-26 DIAGNOSIS — Z170 Estrogen receptor positive status [ER+]: Principal | ICD-10-CM

## 2017-04-26 DIAGNOSIS — C50312 Malignant neoplasm of lower-inner quadrant of left female breast: Secondary | ICD-10-CM

## 2017-04-26 MED ORDER — LIDOCAINE-PRILOCAINE 2.5-2.5 % EX CREA: TOPICAL_CREAM | CUTANEOUS | 3 refills | Status: DC

## 2017-04-26 MED ORDER — PROCHLORPERAZINE MALEATE 10 MG PO TABS: 10.0000 mg | ORAL_TABLET | Freq: Four times a day (QID) | ORAL | 1 refills | Status: DC | PRN

## 2017-04-26 MED ORDER — ONDANSETRON HCL 8 MG PO TABS: 8.0000 mg | ORAL_TABLET | Freq: Two times a day (BID) | ORAL | 1 refills | Status: DC | PRN

## 2017-04-26 NOTE — Telephone Encounter (Signed)
Spoke to pt regarding decision on treatment location. Pt has decided to come to [facility] and receive Taxol/Herceptin. Informed Dr. Lindi Adie of pt wishes. Orders placed for tx to start on 12/13.

## 2017-04-26 NOTE — Progress Notes (Signed)
START OFF PATHWAY REGIMEN - Breast   [id number]:Weekly Paclitaxel + Trastuzumab (ACTH-paclitaxel) part 2 (4 weeks per order sheet):   Administer weekly:     Paclitaxel      Trastuzumab      Trastuzumab   **Always confirm dose/schedule in your pharmacy ordering system**    Patient Characteristics: Postoperative without Neoadjuvant Therapy (Pathologic Staging), Invasive Disease, Adjuvant Therapy, HER2 Positive, ER Positive, Node Negative, pT2 or Higher Therapeutic Status: Postoperative without Neoadjuvant Therapy (Pathologic Staging) AJCC Grade: G3 AJCC N Category: pN0 AJCC M Category: cM0 ER Status: Positive (+) AJCC 8 Stage Grouping: IIA HER2 Status: Positive (+) Oncotype Dx Recurrence Score: Not Appropriate AJCC T Category: pT2 PR Status: Negative (-) Intent of Therapy: Curative Intent, Discussed with Patient

## 2017-04-28 ENCOUNTER — Encounter: Payer: BLUE CROSS/BLUE SHIELD | Admitting: Physical Therapy

## 2017-05-04 ENCOUNTER — Telehealth: Payer: Self-pay | Admitting: Hematology and Oncology

## 2017-05-04 NOTE — Telephone Encounter (Signed)
Scheduled appt per 1/27 sch message - left message for patient with appt date and time   

## 2017-05-05 ENCOUNTER — Telehealth: Payer: Self-pay | Admitting: *Deleted

## 2017-05-05 ENCOUNTER — Ambulatory Visit: Payer: BLUE CROSS/BLUE SHIELD | Admitting: Hematology and Oncology

## 2017-05-05 ENCOUNTER — Other Ambulatory Visit: Payer: BLUE CROSS/BLUE SHIELD

## 2017-05-05 NOTE — Assessment & Plan Note (Deleted)
04/04/2017: Left mastectomy: IDC grade 3, 3.8 cm, intermediate grade DCIS, invasive cancer comes 0.1 cm to posterior margin and less than 0.1 cm to anterior inferior margin at 0/13 lymph nodes, ER 100%, PR 0%, HER-2 positive ratio 2.08 T2 N0 stage IIb  Treatment plan: 1. Adjuvant chemotherapy with Taxol Herceptin weekly x12 followed by Herceptin maintenance for 1 year. 2. Adjuvant antiestrogen therapy with letrozole 2.5 mg daily times 5 years ------------------------------------------------------------------- Current treatment: Cycle 1 day 1 Taxol Herceptin  Consent has been obtained Antiemetics were reviewed Chemo class completed We will be monitoring closely for chemo toxicities. Return to clinic in 1 weeks for toxicity check

## 2017-05-05 NOTE — Telephone Encounter (Signed)
Left vm for pt to return call regarding missed appt. Contact information provided.

## 2017-05-06 ENCOUNTER — Ambulatory Visit: Payer: BLUE CROSS/BLUE SHIELD

## 2017-05-13 ENCOUNTER — Other Ambulatory Visit: Payer: BLUE CROSS/BLUE SHIELD

## 2017-05-13 ENCOUNTER — Ambulatory Visit: Payer: BLUE CROSS/BLUE SHIELD

## 2017-05-13 ENCOUNTER — Telehealth: Payer: Self-pay | Admitting: Hematology and Oncology

## 2017-05-13 NOTE — Telephone Encounter (Signed)
Called and confirmed appts with patient regarding appts that are on 12/28.

## 2017-05-19 NOTE — Assessment & Plan Note (Signed)
04/04/2017: Left mastectomy: IDC grade 3, 3.8 cm, intermediate grade DCIS, invasive cancer comes 0.1 cm to posterior margin and less than 0.1 cm to anterior inferior margin at 0/13 lymph nodes, ER 100%, PR 0%, HER-2 positive ratio 2.08 T2 N0 stage IIb  Treatment plan: 1.Adjuvant chemotherapy with TH followed by Herceptin maintenance for 1 year. 2. Adjuvant radiation therapyif necessary based upon final pathology 3. Adjuvant antiestrogen therapy --------------------------------------------------------- Current treatment: Cycle 1 day 1 Taxol Herceptin weekly  ECHO Anti emetics were reviewed RTC in 1 week for tox check

## 2017-05-20 ENCOUNTER — Ambulatory Visit (HOSPITAL_BASED_OUTPATIENT_CLINIC_OR_DEPARTMENT_OTHER): Payer: BLUE CROSS/BLUE SHIELD | Admitting: Hematology and Oncology

## 2017-05-20 ENCOUNTER — Telehealth: Payer: Self-pay | Admitting: Hematology and Oncology

## 2017-05-20 ENCOUNTER — Other Ambulatory Visit (HOSPITAL_BASED_OUTPATIENT_CLINIC_OR_DEPARTMENT_OTHER): Payer: BLUE CROSS/BLUE SHIELD

## 2017-05-20 ENCOUNTER — Ambulatory Visit (HOSPITAL_BASED_OUTPATIENT_CLINIC_OR_DEPARTMENT_OTHER): Payer: BLUE CROSS/BLUE SHIELD

## 2017-05-20 ENCOUNTER — Ambulatory Visit: Payer: BLUE CROSS/BLUE SHIELD

## 2017-05-20 VITALS — BP 152/86 | HR 82 | Temp 98.6°F | Resp 16

## 2017-05-20 DIAGNOSIS — C50312 Malignant neoplasm of lower-inner quadrant of left female breast: Secondary | ICD-10-CM

## 2017-05-20 DIAGNOSIS — Z170 Estrogen receptor positive status [ER+]: Secondary | ICD-10-CM

## 2017-05-20 DIAGNOSIS — Z95828 Presence of other vascular implants and grafts: Secondary | ICD-10-CM

## 2017-05-20 DIAGNOSIS — Z5112 Encounter for antineoplastic immunotherapy: Secondary | ICD-10-CM

## 2017-05-20 LAB — CBC WITH DIFFERENTIAL/PLATELET
BASO%: 0.7 % (ref 0.0–2.0)
BASOS ABS: 0 10*3/uL (ref 0.0–0.1)
EOS ABS: 0.1 10*3/uL (ref 0.0–0.5)
EOS%: 1.5 % (ref 0.0–7.0)
HEMATOCRIT: 39.3 % (ref 34.8–46.6)
HEMOGLOBIN: 12.9 g/dL (ref 11.6–15.9)
LYMPH#: 1.3 10*3/uL (ref 0.9–3.3)
LYMPH%: 24.6 % (ref 14.0–49.7)
MCH: 30 pg (ref 25.1–34.0)
MCHC: 32.7 g/dL (ref 31.5–36.0)
MCV: 91.5 fL (ref 79.5–101.0)
MONO#: 0.6 10*3/uL (ref 0.1–0.9)
MONO%: 11.3 % (ref 0.0–14.0)
NEUT%: 61.9 % (ref 38.4–76.8)
NEUTROS ABS: 3.1 10*3/uL (ref 1.5–6.5)
Platelets: 221 10*3/uL (ref 145–400)
RBC: 4.3 10*6/uL (ref 3.70–5.45)
RDW: 13.6 % (ref 11.2–14.5)
WBC: 5.1 10*3/uL (ref 3.9–10.3)

## 2017-05-20 LAB — COMPREHENSIVE METABOLIC PANEL
ALBUMIN: 3.8 g/dL (ref 3.5–5.0)
ALK PHOS: 89 U/L (ref 40–150)
ALT: 24 U/L (ref 0–55)
AST: 22 U/L (ref 5–34)
Anion Gap: 10 mEq/L (ref 3–11)
BILIRUBIN TOTAL: 0.3 mg/dL (ref 0.20–1.20)
BUN: 17.9 mg/dL (ref 7.0–26.0)
CALCIUM: 8.8 mg/dL (ref 8.4–10.4)
CO2: 22 mEq/L (ref 22–29)
CREATININE: 0.7 mg/dL (ref 0.6–1.1)
Chloride: 109 mEq/L (ref 98–109)
EGFR: >60 mL/min/{1.73_m2} (ref >60)
Glucose: 100 mg/dl (ref 70–140)
POTASSIUM: 4.1 meq/L (ref 3.5–5.1)
Sodium: 140 mEq/L (ref 136–145)
TOTAL PROTEIN: 7 g/dL (ref 6.4–8.3)

## 2017-05-20 MED ORDER — DIPHENHYDRAMINE HCL 50 MG/ML IJ SOLN
INTRAMUSCULAR | Status: AC
  Filled 2017-05-20: qty 1

## 2017-05-20 MED ORDER — SODIUM CHLORIDE 0.9 % IV SOLN
Freq: Once | INTRAVENOUS | Status: AC
  Administered 2017-05-20: 09:00:00 via INTRAVENOUS

## 2017-05-20 MED ORDER — DIPHENHYDRAMINE HCL 50 MG/ML IJ SOLN
50.0000 mg | Freq: Once | INTRAMUSCULAR | Status: AC
  Administered 2017-05-20: 50 mg via INTRAVENOUS

## 2017-05-20 MED ORDER — VITAMIN D 50 MCG (2000 UT) PO TABS: 4000.0000 [IU] | ORAL_TABLET | Freq: Every day | ORAL | Status: DC

## 2017-05-20 MED ORDER — FAMOTIDINE IN NACL 20-0.9 MG/50ML-% IV SOLN
INTRAVENOUS | Status: AC
  Filled 2017-05-20: qty 50

## 2017-05-20 MED ORDER — HEPARIN SOD (PORK) LOCK FLUSH 100 UNIT/ML IV SOLN
500.0000 [IU] | Freq: Once | INTRAVENOUS | Status: AC | PRN
  Administered 2017-05-20: 500 [IU]
  Filled 2017-05-20: qty 5

## 2017-05-20 MED ORDER — ACETAMINOPHEN 325 MG PO TABS
ORAL_TABLET | ORAL | Status: AC
  Filled 2017-05-20: qty 2

## 2017-05-20 MED ORDER — SODIUM CHLORIDE 0.9 % IV SOLN
80.0000 mg/m2 | Freq: Once | INTRAVENOUS | Status: AC
  Administered 2017-05-20: 138 mg via INTRAVENOUS
  Filled 2017-05-20: qty 23

## 2017-05-20 MED ORDER — FAMOTIDINE IN NACL 20-0.9 MG/50ML-% IV SOLN
20.0000 mg | Freq: Once | INTRAVENOUS | Status: AC
  Administered 2017-05-20: 20 mg via INTRAVENOUS

## 2017-05-20 MED ORDER — SODIUM CHLORIDE 0.9% FLUSH
10.0000 mL | INTRAVENOUS | Status: DC | PRN
  Administered 2017-05-20: 10 mL via INTRAVENOUS
  Filled 2017-05-20: qty 10

## 2017-05-20 MED ORDER — DEXAMETHASONE SODIUM PHOSPHATE 100 MG/10ML IJ SOLN
20.0000 mg | Freq: Once | INTRAMUSCULAR | Status: AC
  Administered 2017-05-20: 20 mg via INTRAVENOUS
  Filled 2017-05-20: qty 2

## 2017-05-20 MED ORDER — ACETAMINOPHEN 325 MG PO TABS
650.0000 mg | ORAL_TABLET | Freq: Once | ORAL | Status: AC
Start: 2017-05-20 — End: 2017-05-20
  Administered 2017-05-20: 650 mg via ORAL

## 2017-05-20 MED ORDER — SODIUM CHLORIDE 0.9 % IV SOLN
4.0000 mg/kg | Freq: Once | INTRAVENOUS | Status: AC
  Administered 2017-05-20: 252 mg via INTRAVENOUS
  Filled 2017-05-20: qty 12

## 2017-05-20 MED ORDER — SODIUM CHLORIDE 0.9% FLUSH
10.0000 mL | INTRAVENOUS | Status: DC | PRN
  Administered 2017-05-20: 10 mL
  Filled 2017-05-20: qty 10

## 2017-05-20 NOTE — Telephone Encounter (Signed)
No 12/28 LOS

## 2017-05-20 NOTE — Patient Instructions (Signed)
Implanted Port Home Guide An implanted port is a type of central line that is placed under the skin. Central lines are used to provide IV access when treatment or nutrition needs to be given through a person's veins. Implanted ports are used for long-term IV access. An implanted port may be placed because:  You need IV medicine that would be irritating to the small veins in your hands or arms.  You need long-term IV medicines, such as antibiotics.  You need IV nutrition for a long period.  You need frequent blood draws for lab tests.  You need dialysis.  Implanted ports are usually placed in the chest area, but they can also be placed in the upper arm, the abdomen, or the leg. An implanted port has two main parts:  Reservoir. The reservoir is round and will appear as a small, raised area under your skin. The reservoir is the part where a needle is inserted to give medicines or draw blood.  Catheter. The catheter is a thin, flexible tube that extends from the reservoir. The catheter is placed into a large vein. Medicine that is inserted into the reservoir goes into the catheter and then into the vein.  How will I care for my incision site? Do not get the incision site wet. Bathe or shower as directed by your health care provider. How is my port accessed? Special steps must be taken to access the port:  Before the port is accessed, a numbing cream can be placed on the skin. This helps numb the skin over the port site.  Your health care provider uses a sterile technique to access the port. ? Your health care provider must put on a mask and sterile gloves. ? The skin over your port is cleaned carefully with an antiseptic and allowed to dry. ? The port is gently pinched between sterile gloves, and a needle is inserted into the port.  Only "non-coring" port needles should be used to access the port. Once the port is accessed, a blood return should be checked. This helps ensure that the port  is in the vein and is not clogged.  If your port needs to remain accessed for a constant infusion, a clear (transparent) bandage will be placed over the needle site. The bandage and needle will need to be changed every week, or as directed by your health care provider.  Keep the bandage covering the needle clean and dry. Do not get it wet. Follow your health care provider's instructions on how to take a shower or bath while the port is accessed.  If your port does not need to stay accessed, no bandage is needed over the port.  What is flushing? Flushing helps keep the port from getting clogged. Follow your health care provider's instructions on how and when to flush the port. Ports are usually flushed with saline solution or a medicine called heparin. The need for flushing will depend on how the port is used.  If the port is used for intermittent medicines or blood draws, the port will need to be flushed: ? After medicines have been given. ? After blood has been drawn. ? As part of routine maintenance.  If a constant infusion is running, the port may not need to be flushed.  How long will my port stay implanted? The port can stay in for as long as your health care provider thinks it is needed. When it is time for the port to come out, surgery will be   done to remove it. The procedure is similar to the one performed when the port was put in. When should I seek immediate medical care? When you have an implanted port, you should seek immediate medical care if:  You notice a bad smell coming from the incision site.  You have swelling, redness, or drainage at the incision site.  You have more swelling or pain at the port site or the surrounding area.  You have a fever that is not controlled with medicine.  This information is not intended to replace advice given to you by your health care provider. Make sure you discuss any questions you have with your health care provider. Document  Released: 05/10/2005 Document Revised: 10/16/2015 Document Reviewed: 01/15/2013 [employer] Interactive Patient Education  2017 [employer]  

## 2017-05-20 NOTE — Patient Instructions (Addendum)
[facility] Discharge Instructions for Patients Receiving Chemotherapy  Today you received the following chemotherapy agents Herceptin and Taxol  To help prevent nausea and vomiting after your treatment, we encourage you to take your nausea medication as directed.    If you develop nausea and vomiting that is not controlled by your nausea medication, call the clinic.   BELOW ARE SYMPTOMS THAT SHOULD BE REPORTED IMMEDIATELY:  *FEVER GREATER THAN 100.5 F  *CHILLS WITH OR WITHOUT FEVER  NAUSEA AND VOMITING THAT IS NOT CONTROLLED WITH YOUR NAUSEA MEDICATION  *UNUSUAL SHORTNESS OF BREATH  *UNUSUAL BRUISING OR BLEEDING  TENDERNESS IN MOUTH AND THROAT WITH OR WITHOUT PRESENCE OF ULCERS  *URINARY PROBLEMS  *BOWEL PROBLEMS  UNUSUAL RASH Items with * indicate a potential emergency and should be followed up as soon as possible.  Feel free to call the clinic should you have any questions or concerns. The clinic phone number is [phone].  Please show the CHEMO ALERT CARD at check-in to the Emergency Department and triage nurse.   Trastuzumab injection for infusion What is this medicine? TRASTUZUMAB (tras TOO zoo mab) is a monoclonal antibody. It is used to treat breast cancer and stomach cancer. This medicine may be used for other purposes; ask your health care provider or pharmacist if you have questions. COMMON BRAND NAME(S): Herceptin What should I tell my health care provider before I take this medicine? They need to know if you have any of these conditions: -heart disease -heart failure -lung or breathing disease, like asthma -an unusual or allergic reaction to trastuzumab, benzyl alcohol, or other medications, foods, dyes, or preservatives -pregnant or trying to get pregnant -breast-feeding How should I use this medicine? This drug is given as an infusion into a vein. It is administered in a hospital or clinic by a specially trained health care  professional. Talk to your pediatrician regarding the use of this medicine in children. This medicine is not approved for use in children. Overdosage: If you think you have taken too much of this medicine contact a poison control center or emergency room at once. NOTE: This medicine is only for you. Do not share this medicine with others. What if I miss a dose? It is important not to miss a dose. Call your doctor or health care professional if you are unable to keep an appointment. What may interact with this medicine? This medicine may interact with the following medications: -certain types of chemotherapy, such as daunorubicin, doxorubicin, epirubicin, and idarubicin This list may not describe all possible interactions. Give your health care provider a list of all the medicines, herbs, non-prescription drugs, or dietary supplements you use. Also tell them if you smoke, drink alcohol, or use illegal drugs. Some items may interact with your medicine. What should I watch for while using this medicine? Visit your doctor for checks on your progress. Report any side effects. Continue your course of treatment even though you feel ill unless your doctor tells you to stop. Call your doctor or health care professional for advice if you get a fever, chills or sore throat, or other symptoms of a cold or flu. Do not treat yourself. Try to avoid being around people who are sick. You may experience fever, chills and shaking during your first infusion. These effects are usually mild and can be treated with other medicines. Report any side effects during the infusion to your health care professional. Fever and chills usually do not happen with later infusions. Do   not become pregnant while taking this medicine or for 7 months after stopping it. Women should inform their doctor if they wish to become pregnant or think they might be pregnant. Women of child-bearing potential will need to have a negative pregnancy test  before starting this medicine. There is a potential for serious side effects to an unborn child. Talk to your health care professional or pharmacist for more information. Do not breast-feed an infant while taking this medicine or for 7 months after stopping it. Women must use effective birth control with this medicine. What side effects may I notice from receiving this medicine? Side effects that you should report to your doctor or health care professional as soon as possible: -allergic reactions like skin rash, itching or hives, swelling of the face, lips, or tongue -chest pain or palpitations -cough -dizziness -feeling faint or lightheaded, falls -fever -general ill feeling or flu-like symptoms -signs of worsening heart failure like breathing problems; swelling in your legs and feet -unusually weak or tired Side effects that usually do not require medical attention (report to your doctor or health care professional if they continue or are bothersome): -bone pain -changes in taste -diarrhea -joint pain -nausea/vomiting -weight loss This list may not describe all possible side effects. Call your doctor for medical advice about side effects. You may report side effects to FDA at 1-800-FDA-1088. Where should I keep my medicine? This drug is given in a hospital or clinic and will not be stored at home. NOTE: This sheet is a summary. It may not cover all possible information. If you have questions about this medicine, talk to your doctor, pharmacist, or health care provider.  2018 Elsevier/Gold Standard (2016-05-04 14:37:52)  Paclitaxel injection What is this medicine? PACLITAXEL (PAK li TAX el) is a chemotherapy drug. It targets fast dividing cells, like cancer cells, and causes these cells to die. This medicine is used to treat ovarian cancer, breast cancer, and other cancers. This medicine may be used for other purposes; ask your health care provider or pharmacist if you have  questions. COMMON BRAND NAME(S): Onxol, Taxol What should I tell my health care provider before I take this medicine? They need to know if you have any of these conditions: -blood disorders -irregular heartbeat -infection (especially a virus infection such as chickenpox, cold sores, or herpes) -liver disease -previous or ongoing radiation therapy -an unusual or allergic reaction to paclitaxel, alcohol, polyoxyethylated castor oil, other chemotherapy agents, other medicines, foods, dyes, or preservatives -pregnant or trying to get pregnant -breast-feeding How should I use this medicine? This drug is given as an infusion into a vein. It is administered in a hospital or clinic by a specially trained health care professional. Talk to your pediatrician regarding the use of this medicine in children. Special care may be needed. Overdosage: If you think you have taken too much of this medicine contact a poison control center or emergency room at once. NOTE: This medicine is only for you. Do not share this medicine with others. What if I miss a dose? It is important not to miss your dose. Call your doctor or health care professional if you are unable to keep an appointment. What may interact with this medicine? Do not take this medicine with any of the following medications: -disulfiram -metronidazole This medicine may also interact with the following medications: -cyclosporine -diazepam -ketoconazole -medicines to increase blood counts like filgrastim, pegfilgrastim, sargramostim -other chemotherapy drugs like cisplatin, doxorubicin, epirubicin, etoposide, teniposide, vincristine -quinidine -testosterone -vaccines -  verapamil Talk to your doctor or health care professional before taking any of these medicines: -acetaminophen -aspirin -ibuprofen -ketoprofen -naproxen This list may not describe all possible interactions. Give your health care provider a list of all the medicines, herbs,  non-prescription drugs, or dietary supplements you use. Also tell them if you smoke, drink alcohol, or use illegal drugs. Some items may interact with your medicine. What should I watch for while using this medicine? Your condition will be monitored carefully while you are receiving this medicine. You will need important blood work done while you are taking this medicine. This medicine can cause serious allergic reactions. To reduce your risk you will need to take other medicine(s) before treatment with this medicine. If you experience allergic reactions like skin rash, itching or hives, swelling of the face, lips, or tongue, tell your doctor or health care professional right away. In some cases, you may be given additional medicines to help with side effects. Follow all directions for their use. This drug may make you feel generally unwell. This is not uncommon, as chemotherapy can affect healthy cells as well as cancer cells. Report any side effects. Continue your course of treatment even though you feel ill unless your doctor tells you to stop. Call your doctor or health care professional for advice if you get a fever, chills or sore throat, or other symptoms of a cold or flu. Do not treat yourself. This drug decreases your body's ability to fight infections. Try to avoid being around people who are sick. This medicine may increase your risk to bruise or bleed. Call your doctor or health care professional if you notice any unusual bleeding. Be careful brushing and flossing your teeth or using a toothpick because you may get an infection or bleed more easily. If you have any dental work done, tell your dentist you are receiving this medicine. Avoid taking products that contain aspirin, acetaminophen, ibuprofen, naproxen, or ketoprofen unless instructed by your doctor. These medicines may hide a fever. Do not become pregnant while taking this medicine. Women should inform their doctor if they wish to  become pregnant or think they might be pregnant. There is a potential for serious side effects to an unborn child. Talk to your health care professional or pharmacist for more information. Do not breast-feed an infant while taking this medicine. Men are advised not to father a child while receiving this medicine. This product may contain alcohol. Ask your pharmacist or healthcare provider if this medicine contains alcohol. Be sure to tell all healthcare providers you are taking this medicine. Certain medicines, like metronidazole and disulfiram, can cause an unpleasant reaction when taken with alcohol. The reaction includes flushing, headache, nausea, vomiting, sweating, and increased thirst. The reaction can last from 30 minutes to several hours. What side effects may I notice from receiving this medicine? Side effects that you should report to your doctor or health care professional as soon as possible: -allergic reactions like skin rash, itching or hives, swelling of the face, lips, or tongue -low blood counts - This drug may decrease the number of white blood cells, red blood cells and platelets. You may be at increased risk for infections and bleeding. -signs of infection - fever or chills, cough, sore throat, pain or difficulty passing urine -signs of decreased platelets or bleeding - bruising, pinpoint red spots on the skin, black, tarry stools, nosebleeds -signs of decreased red blood cells - unusually weak or tired, fainting spells, lightheadedness -breathing problems -chest pain -  high or low blood pressure -mouth sores -nausea and vomiting -pain, swelling, redness or irritation at the injection site -pain, tingling, numbness in the hands or feet -slow or irregular heartbeat -swelling of the ankle, feet, hands Side effects that usually do not require medical attention (report to your doctor or health care professional if they continue or are bothersome): -bone pain -complete hair loss  including hair on your head, underarms, pubic hair, eyebrows, and eyelashes -changes in the color of fingernails -diarrhea -loosening of the fingernails -loss of appetite -muscle or joint pain -red flush to skin -sweating This list may not describe all possible side effects. Call your doctor for medical advice about side effects. You may report side effects to FDA at 1-800-FDA-1088. Where should I keep my medicine? This drug is given in a hospital or clinic and will not be stored at home. NOTE: This sheet is a summary. It may not cover all possible information. If you have questions about this medicine, talk to your doctor, pharmacist, or health care provider.  2018 Elsevier/Gold Standard (2015-03-11 19:58:00)  

## 2017-05-20 NOTE — Progress Notes (Signed)
Patient Care Team: Molli Posey, MD as PCP - General (Obstetrics and Gynecology)  DIAGNOSIS:  Encounter Diagnosis  Name Primary?  . Malignant neoplasm of lower-inner quadrant of left breast in female, estrogen receptor positive ([place])     SUMMARY OF ONCOLOGIC HISTORY:   Malignant neoplasm of lower-inner quadrant of left breast in female, estrogen receptor positive ([place])   02/28/2017 Initial Diagnosis    Palpable left breast masses 2.5 cm at 7:00 and another 2.5 cm at 6:00 1.9 cm apart biopsy: IDC grade 2 with DCISER 100%, PR 0%, Ki-67 20%, HER-2 positiveratio 2.08, axilla negative; in addition indeterminate calcifications medially and laterally,T2 N0 stage 2A clinical stage AJCC 8      04/04/2017 Surgery    Left mastectomy: IDC grade 3, 3.8 cm, intermediate grade DCIS, invasive cancer comes 0.1 cm to posterior margin and less than 0.1 cm to anterior inferior margin at 0/13 lymph nodes, ER 100%, PR 0%, HER-2 positive ratio 2.08 T2 N0 stage IIb      05/20/2017 -  Chemotherapy    Taxol Herceptin weekly x12 followed by Herceptin maintenance every 3 weeks for 1 year (patient refused [place])        CHIEF COMPLIANT: Cycle 1 Taxol Herceptin  INTERVAL HISTORY: Seara Hinesley is a 61 year old with above-mentioned history of left breast cancer treated with mastectomy and is here to receive her first cycle of Taxol Herceptin adjuvant chemotherapy.  She did not want to receive [facility] because of numerous reports of permanent hair loss.  She is willing to take Taxol and Herceptin.  She is anxious to get started with the treatment  REVIEW OF SYSTEMS:   Constitutional: Denies fevers, chills or abnormal weight loss Eyes: Denies blurriness of vision Ears, nose, mouth, throat, and face: Denies mucositis or sore throat Respiratory: Denies cough, dyspnea or wheezes Cardiovascular: Denies palpitation, chest discomfort Gastrointestinal:  Denies nausea, heartburn or change in bowel habits Skin: Denies  abnormal skin rashes Lymphatics: Denies new lymphadenopathy or easy bruising Neurological:Denies numbness, tingling or new weaknesses Behavioral/Psych: Mood is stable, no new changes  Extremities: No lower extremity edema Breast: Left mastectomy All other systems were reviewed with the patient and are negative.  I have reviewed the past medical history, past surgical history, social history and family history with the patient and they are unchanged from previous note.  ALLERGIES:  is allergic to amoxicillin and sulfa antibiotics.  MEDICATIONS:  Current Outpatient Medications  Medication Sig Dispense Refill  . acetaminophen (TYLENOL) 500 MG tablet Take 250 mg by mouth every 8 (eight) hours as needed for mild pain or headache.     . Ascorbic Acid (VITAMIN C) 1000 MG tablet Take 1,000 mg by mouth daily.    . cholecalciferol 2000 units tablet Take 2 tablets (4,000 Units total) by mouth daily.    Marland Kitchen HYDROcodone-acetaminophen (NORCO/VICODIN) 5-325 MG tablet Take 1-2 tablets every 6 (six) hours as needed by mouth for moderate pain or severe pain. 20 tablet 0  . lidocaine-prilocaine (EMLA) cream Apply to affected area once 30 g 3  . ondansetron (ZOFRAN) 8 MG tablet Take 1 tablet (8 mg total) by mouth 2 (two) times daily as needed (Nausea or vomiting). 30 tablet 1  . prochlorperazine (COMPAZINE) 10 MG tablet Take 1 tablet (10 mg total) by mouth every 6 (six) hours as needed (Nausea or vomiting). 30 tablet 1   No current facility-administered medications for this visit.     PHYSICAL EXAMINATION: ECOG PERFORMANCE STATUS: 1 - Symptomatic but completely ambulatory  Vitals:   05/20/17 0822  BP: (!) 162/89  Pulse: 87  Resp: 20  Temp: 97.8 F (36.6 C)  SpO2: 98%   Filed Weights   05/20/17 0822  Weight: 139 lb (63 kg)    GENERAL:alert, no distress and comfortable SKIN: skin color, texture, turgor are normal, no rashes or significant lesions EYES: normal, Conjunctiva are pink and  non-injected, sclera clear OROPHARYNX:no exudate, no erythema and lips, buccal mucosa, and tongue normal  NECK: supple, thyroid normal size, non-tender, without nodularity LYMPH:  no palpable lymphadenopathy in the cervical, axillary or inguinal LUNGS: clear to auscultation and percussion with normal breathing effort HEART: regular rate & rhythm and no murmurs and no lower extremity edema ABDOMEN:abdomen soft, non-tender and normal bowel sounds MUSCULOSKELETAL:no cyanosis of digits and no clubbing  NEURO: alert & oriented x 3 with fluent speech, no focal motor/sensory deficits EXTREMITIES: No lower extremity edema  LABORATORY DATA:  I have reviewed the data as listed   Chemistry      Component Value Date/Time   NA 139 03/28/2017 0948   NA 143 03/09/2017 1229   K 4.3 03/28/2017 0948   K 4.2 03/09/2017 1229   CL 109 03/28/2017 0948   CO2 23 03/28/2017 0948   CO2 26 03/09/2017 1229   BUN 10 03/28/2017 0948   BUN 8.3 03/09/2017 1229   CREATININE 0.58 03/28/2017 0948   CREATININE 0.7 03/09/2017 1229      Component Value Date/Time   CALCIUM 9.3 03/28/2017 0948   CALCIUM 9.3 03/09/2017 1229   ALKPHOS 78 03/09/2017 1229   AST 15 03/09/2017 1229   ALT 13 03/09/2017 1229   BILITOT 0.37 03/09/2017 1229       Lab Results  Component Value Date   WBC 5.1 05/20/2017   HGB 12.9 05/20/2017   HCT 39.3 05/20/2017   MCV 91.5 05/20/2017   PLT 221 05/20/2017   NEUTROABS 3.1 05/20/2017    ASSESSMENT & PLAN:  Malignant neoplasm of lower-inner quadrant of left breast in female, estrogen receptor positive ([place]) 04/04/2017: Left mastectomy: IDC grade 3, 3.8 cm, intermediate grade DCIS, invasive cancer comes 0.1 cm to posterior margin and less than 0.1 cm to anterior inferior margin at 0/13 lymph nodes, ER 100%, PR 0%, HER-2 positive ratio 2.08 T2 N0 stage IIb  Treatment plan: 1.Adjuvant chemotherapy with TH followed by Herceptin maintenance for 1 year. 2. Adjuvant radiation therapyif  necessary based upon final pathology 3. Adjuvant antiestrogen therapy --------------------------------------------------------- Current treatment: Cycle 1 day 1 Taxol Herceptin weekly  Patient plans to use marijuana daily to decrease adverse effects of chemo ECHO 03/28/2017: EF 60-65% Anti emetics were reviewed RTC in 2 week for follow-up with me and weekly for chemo  I spent 25 minutes talking to the patient of which more than half was spent in counseling and coordination of care.  No orders of the defined types were placed in this encounter.  The patient has a good understanding of the overall plan. she agrees with it. she will call with any problems that may develop before the next visit here.   Harriette Ohara, MD 05/20/17

## 2017-05-22 ENCOUNTER — Other Ambulatory Visit: Payer: Self-pay | Admitting: Hematology and Oncology

## 2017-05-23 ENCOUNTER — Telehealth: Payer: Self-pay | Admitting: *Deleted

## 2017-05-23 NOTE — Telephone Encounter (Signed)
Called pt to assess needs after 1st chemo. Relate doing well. No c/o N/V. Did c/o having sleepless nights. Dr. Lindi Adie notified.Denies further needs or questions. Encourage pt to call with needs.

## 2017-05-27 ENCOUNTER — Ambulatory Visit (HOSPITAL_BASED_OUTPATIENT_CLINIC_OR_DEPARTMENT_OTHER): Payer: BLUE CROSS/BLUE SHIELD

## 2017-05-27 ENCOUNTER — Other Ambulatory Visit (HOSPITAL_BASED_OUTPATIENT_CLINIC_OR_DEPARTMENT_OTHER): Payer: BLUE CROSS/BLUE SHIELD

## 2017-05-27 VITALS — BP 165/71 | HR 68 | Temp 97.7°F | Resp 16

## 2017-05-27 DIAGNOSIS — Z5112 Encounter for antineoplastic immunotherapy: Secondary | ICD-10-CM

## 2017-05-27 DIAGNOSIS — C50312 Malignant neoplasm of lower-inner quadrant of left female breast: Secondary | ICD-10-CM

## 2017-05-27 DIAGNOSIS — Z170 Estrogen receptor positive status [ER+]: Principal | ICD-10-CM

## 2017-05-27 DIAGNOSIS — Z5111 Encounter for antineoplastic chemotherapy: Secondary | ICD-10-CM | POA: Diagnosis not present

## 2017-05-27 LAB — COMPREHENSIVE METABOLIC PANEL
ALT: 40 U/L (ref 0–55)
ANION GAP: 6 meq/L (ref 3–11)
AST: 22 U/L (ref 5–34)
Albumin: 3.9 g/dL (ref 3.5–5.0)
Alkaline Phosphatase: 95 U/L (ref 40–150)
BUN: 11.3 mg/dL (ref 7.0–26.0)
CALCIUM: 9.1 mg/dL (ref 8.4–10.4)
CHLORIDE: 108 meq/L (ref 98–109)
CO2: 24 mEq/L (ref 22–29)
CREATININE: 0.7 mg/dL (ref 0.6–1.1)
EGFR: >60 mL/min/{1.73_m2} (ref >60)
Glucose: 105 mg/dl (ref 70–140)
Potassium: 4.3 mEq/L (ref 3.5–5.1)
Sodium: 138 mEq/L (ref 136–145)
Total Bilirubin: 0.55 mg/dL (ref 0.20–1.20)
Total Protein: 7 g/dL (ref 6.4–8.3)

## 2017-05-27 LAB — CBC WITH DIFFERENTIAL/PLATELET
BASO%: 1.1 % (ref 0.0–2.0)
BASOS ABS: 0 10*3/uL (ref 0.0–0.1)
EOS%: 0.7 % (ref 0.0–7.0)
Eosinophils Absolute: 0 10*3/uL (ref 0.0–0.5)
HCT: 37.1 % (ref 34.8–46.6)
HGB: 12.3 g/dL (ref 11.6–15.9)
LYMPH%: 20.1 % (ref 14.0–49.7)
MCH: 30.2 pg (ref 25.1–34.0)
MCHC: 33.2 g/dL (ref 31.5–36.0)
MCV: 90.9 fL (ref 79.5–101.0)
MONO#: 0.4 10*3/uL (ref 0.1–0.9)
MONO%: 9.5 % (ref 0.0–14.0)
NEUT#: 3 10*3/uL (ref 1.5–6.5)
NEUT%: 68.6 % (ref 38.4–76.8)
PLATELETS: 229 10*3/uL (ref 145–400)
RBC: 4.08 10*6/uL (ref 3.70–5.45)
RDW: 13.5 % (ref 11.2–14.5)
WBC: 4.3 10*3/uL (ref 3.9–10.3)
lymph#: 0.9 10*3/uL (ref 0.9–3.3)

## 2017-05-27 MED ORDER — SODIUM CHLORIDE 0.9% FLUSH
10.0000 mL | INTRAVENOUS | Status: DC | PRN
  Administered 2017-05-27: 10 mL
  Filled 2017-05-27: qty 10

## 2017-05-27 MED ORDER — ACETAMINOPHEN 325 MG PO TABS
650.0000 mg | ORAL_TABLET | Freq: Once | ORAL | Status: AC
  Administered 2017-05-27: 650 mg via ORAL

## 2017-05-27 MED ORDER — SODIUM CHLORIDE 0.9 % IV SOLN
80.0000 mg/m2 | Freq: Once | INTRAVENOUS | Status: AC
  Administered 2017-05-27: 138 mg via INTRAVENOUS
  Filled 2017-05-27: qty 23

## 2017-05-27 MED ORDER — FAMOTIDINE IN NACL 20-0.9 MG/50ML-% IV SOLN
20.0000 mg | Freq: Once | INTRAVENOUS | Status: AC
  Administered 2017-05-27: 20 mg via INTRAVENOUS

## 2017-05-27 MED ORDER — ACETAMINOPHEN 325 MG PO TABS
ORAL_TABLET | ORAL | Status: AC
  Filled 2017-05-27: qty 2

## 2017-05-27 MED ORDER — HEPARIN SOD (PORK) LOCK FLUSH 100 UNIT/ML IV SOLN
500.0000 [IU] | Freq: Once | INTRAVENOUS | Status: AC | PRN
  Administered 2017-05-27: 500 [IU]
  Filled 2017-05-27: qty 5

## 2017-05-27 MED ORDER — DIPHENHYDRAMINE HCL 50 MG/ML IJ SOLN
25.0000 mg | Freq: Once | INTRAMUSCULAR | Status: AC
  Administered 2017-05-27: 25 mg via INTRAVENOUS

## 2017-05-27 MED ORDER — TRASTUZUMAB CHEMO 150 MG IV SOLR
2.0000 mg/kg | Freq: Once | INTRAVENOUS | Status: AC
  Administered 2017-05-27: 126 mg via INTRAVENOUS
  Filled 2017-05-27: qty 6

## 2017-05-27 MED ORDER — SODIUM CHLORIDE 0.9 % IV SOLN
Freq: Once | INTRAVENOUS | Status: AC
  Administered 2017-05-27: 13:00:00 via INTRAVENOUS

## 2017-05-27 MED ORDER — FAMOTIDINE IN NACL 20-0.9 MG/50ML-% IV SOLN
INTRAVENOUS | Status: AC
  Filled 2017-05-27: qty 50

## 2017-05-27 MED ORDER — DIPHENHYDRAMINE HCL 50 MG/ML IJ SOLN
INTRAMUSCULAR | Status: AC
  Filled 2017-05-27: qty 1

## 2017-05-27 NOTE — Progress Notes (Signed)
Per Dr. Lindi Adie, eliminate dexamethasone from treatment plan.

## 2017-05-27 NOTE — Progress Notes (Signed)
Ok to discontinue premedication decadron per Dr. Lindi Adie due to patient side effects.  Cyndia Bent RN

## 2017-05-27 NOTE — Patient Instructions (Addendum)
[facility] Discharge Instructions for Patients Receiving Chemotherapy  Today you received the following chemotherapy agents Taxol and Herceptin  To help prevent nausea and vomiting after your treatment, we encourage you to take your nausea medication as directed   If you develop nausea and vomiting that is not controlled by your nausea medication, call the clinic.   BELOW ARE SYMPTOMS THAT SHOULD BE REPORTED IMMEDIATELY:  *FEVER GREATER THAN 100.5 F  *CHILLS WITH OR WITHOUT FEVER  NAUSEA AND VOMITING THAT IS NOT CONTROLLED WITH YOUR NAUSEA MEDICATION  *UNUSUAL SHORTNESS OF BREATH  *UNUSUAL BRUISING OR BLEEDING  TENDERNESS IN MOUTH AND THROAT WITH OR WITHOUT PRESENCE OF ULCERS  *URINARY PROBLEMS  *BOWEL PROBLEMS  UNUSUAL RASH Items with * indicate a potential emergency and should be followed up as soon as possible.  Feel free to call the clinic should you have any questions or concerns. The clinic phone number is [phone].  Please show the CHEMO ALERT CARD at check-in to the Emergency Department and triage nurse.   

## 2017-06-02 NOTE — Assessment & Plan Note (Signed)
04/04/2017:Left mastectomy: IDC grade 3, 3.8 cm, intermediate grade DCIS, invasive cancer comes 0.1 cm to posterior margin and less than 0.1 cm to anterior inferior margin at 0/13 lymph nodes, ER 100%, PR 0%, HER-2 positive ratio 2.08 T2 N0 stage IIb  Treatment plan: 1.Adjuvant chemotherapywithTH followed by Herceptin maintenance for 1 year. 2. Adjuvant radiation therapyif necessary based upon final pathology 3. Adjuvant antiestrogen therapy --------------------------------------------------------- Current treatment: Cycle 2 day 1 Taxol Herceptin weekly  Patient plans to use marijuana daily to decrease adverse effects of chemo ECHO 03/28/2017: EF 60-65% Anti emetics were reviewed RTC in 2 week for follow-up with me and weekly for chemo

## 2017-06-03 ENCOUNTER — Inpatient Hospital Stay: Payer: BLUE CROSS/BLUE SHIELD | Attending: Hematology and Oncology | Admitting: Hematology and Oncology

## 2017-06-03 ENCOUNTER — Inpatient Hospital Stay: Payer: BLUE CROSS/BLUE SHIELD

## 2017-06-03 ENCOUNTER — Encounter: Payer: Self-pay | Admitting: *Deleted

## 2017-06-03 VITALS — BP 156/82

## 2017-06-03 DIAGNOSIS — Z5112 Encounter for antineoplastic immunotherapy: Secondary | ICD-10-CM | POA: Diagnosis not present

## 2017-06-03 DIAGNOSIS — Z170 Estrogen receptor positive status [ER+]: Secondary | ICD-10-CM | POA: Insufficient documentation

## 2017-06-03 DIAGNOSIS — C50312 Malignant neoplasm of lower-inner quadrant of left female breast: Secondary | ICD-10-CM

## 2017-06-03 DIAGNOSIS — Z9012 Acquired absence of left breast and nipple: Secondary | ICD-10-CM

## 2017-06-03 DIAGNOSIS — Z5111 Encounter for antineoplastic chemotherapy: Secondary | ICD-10-CM | POA: Insufficient documentation

## 2017-06-03 DIAGNOSIS — Z95828 Presence of other vascular implants and grafts: Secondary | ICD-10-CM | POA: Insufficient documentation

## 2017-06-03 LAB — COMPREHENSIVE METABOLIC PANEL
ALT: 23 U/L (ref 0–55)
ANION GAP: 6 (ref 3–11)
AST: 16 U/L (ref 5–34)
Albumin: 3.8 g/dL (ref 3.5–5.0)
Alkaline Phosphatase: 86 U/L (ref 40–150)
BUN: 11 mg/dL (ref 7–26)
CHLORIDE: 111 mmol/L — AB (ref 98–109)
CO2: 24 mmol/L (ref 22–29)
CREATININE: 0.66 mg/dL (ref 0.60–1.10)
Calcium: 8.7 mg/dL (ref 8.4–10.4)
GFR calc non Af Amer: >60 mL/min (ref >60)
Glucose, Bld: 94 mg/dL (ref 70–140)
Potassium: 4.3 mmol/L (ref 3.3–4.7)
SODIUM: 141 mmol/L (ref 136–145)
Total Bilirubin: 0.3 mg/dL (ref 0.2–1.2)
Total Protein: 6.7 g/dL (ref 6.4–8.3)

## 2017-06-03 LAB — CBC WITH DIFFERENTIAL/PLATELET
Basophils Absolute: 0 10*3/uL (ref 0.0–0.1)
Basophils Relative: 1 %
EOS ABS: 0 10*3/uL (ref 0.0–0.5)
EOS PCT: 1 %
HCT: 35.3 % (ref 34.8–46.6)
Hemoglobin: 11.9 g/dL (ref 11.6–15.9)
LYMPHS ABS: 0.9 10*3/uL (ref 0.9–3.3)
Lymphocytes Relative: 26 %
MCH: 30.6 pg (ref 25.1–34.0)
MCHC: 33.6 g/dL (ref 31.5–36.0)
MCV: 91 fL (ref 79.5–101.0)
Monocytes Absolute: 0.2 10*3/uL (ref 0.1–0.9)
Monocytes Relative: 7 %
Neutro Abs: 2.2 10*3/uL (ref 1.5–6.5)
Neutrophils Relative %: 65 %
PLATELETS: 265 10*3/uL (ref 145–400)
RBC: 3.88 MIL/uL (ref 3.70–5.45)
RDW: 13.5 % (ref 11.2–16.1)
WBC: 3.4 10*3/uL — AB (ref 3.9–10.3)

## 2017-06-03 MED ORDER — SODIUM CHLORIDE 0.9 % IV SOLN
Freq: Once | INTRAVENOUS | Status: AC
  Administered 2017-06-03: 12:00:00 via INTRAVENOUS

## 2017-06-03 MED ORDER — FAMOTIDINE IN NACL 20-0.9 MG/50ML-% IV SOLN
INTRAVENOUS | Status: AC
  Filled 2017-06-03: qty 50

## 2017-06-03 MED ORDER — SODIUM CHLORIDE 0.9 % IV SOLN
2.0000 mg/kg | Freq: Once | INTRAVENOUS | Status: AC
  Administered 2017-06-03: 126 mg via INTRAVENOUS
  Filled 2017-06-03: qty 6

## 2017-06-03 MED ORDER — SODIUM CHLORIDE 0.9% FLUSH
10.0000 mL | INTRAVENOUS | Status: DC | PRN
  Administered 2017-06-03: 10 mL via INTRAVENOUS
  Filled 2017-06-03: qty 10

## 2017-06-03 MED ORDER — HEPARIN SOD (PORK) LOCK FLUSH 100 UNIT/ML IV SOLN
500.0000 [IU] | Freq: Once | INTRAVENOUS | Status: AC | PRN
  Administered 2017-06-03: 500 [IU]
  Filled 2017-06-03: qty 5

## 2017-06-03 MED ORDER — SODIUM CHLORIDE 0.9 % IV SOLN
80.0000 mg/m2 | Freq: Once | INTRAVENOUS | Status: AC
  Administered 2017-06-03: 138 mg via INTRAVENOUS
  Filled 2017-06-03: qty 23

## 2017-06-03 MED ORDER — SODIUM CHLORIDE 0.9% FLUSH
10.0000 mL | INTRAVENOUS | Status: DC | PRN
  Administered 2017-06-03: 10 mL
  Filled 2017-06-03: qty 10

## 2017-06-03 MED ORDER — DIPHENHYDRAMINE HCL 50 MG/ML IJ SOLN
INTRAMUSCULAR | Status: AC
  Filled 2017-06-03: qty 1

## 2017-06-03 MED ORDER — FAMOTIDINE IN NACL 20-0.9 MG/50ML-% IV SOLN
20.0000 mg | Freq: Once | INTRAVENOUS | Status: AC
  Administered 2017-06-03: 20 mg via INTRAVENOUS

## 2017-06-03 MED ORDER — ACETAMINOPHEN 325 MG PO TABS
ORAL_TABLET | ORAL | Status: AC
  Filled 2017-06-03: qty 2

## 2017-06-03 MED ORDER — DIPHENHYDRAMINE HCL 50 MG/ML IJ SOLN
25.0000 mg | Freq: Once | INTRAMUSCULAR | Status: AC
  Administered 2017-06-03: 25 mg via INTRAVENOUS

## 2017-06-03 MED ORDER — ACETAMINOPHEN 325 MG PO TABS
650.0000 mg | ORAL_TABLET | Freq: Once | ORAL | Status: AC
  Administered 2017-06-03: 650 mg via ORAL

## 2017-06-03 NOTE — Patient Instructions (Signed)
[facility] Discharge Instructions for Patients Receiving Chemotherapy  Today you received the following chemotherapy agents:  Herceptin and Taxol.  To help prevent nausea and vomiting after your treatment, we encourage you to take your nausea medication as directed.   If you develop nausea and vomiting that is not controlled by your nausea medication, call the clinic.   BELOW ARE SYMPTOMS THAT SHOULD BE REPORTED IMMEDIATELY:  *FEVER GREATER THAN 100.5 F  *CHILLS WITH OR WITHOUT FEVER  NAUSEA AND VOMITING THAT IS NOT CONTROLLED WITH YOUR NAUSEA MEDICATION  *UNUSUAL SHORTNESS OF BREATH  *UNUSUAL BRUISING OR BLEEDING  TENDERNESS IN MOUTH AND THROAT WITH OR WITHOUT PRESENCE OF ULCERS  *URINARY PROBLEMS  *BOWEL PROBLEMS  UNUSUAL RASH Items with * indicate a potential emergency and should be followed up as soon as possible.  Feel free to call the clinic should you have any questions or concerns. The clinic phone number is [phone].  Please show the CHEMO ALERT CARD at check-in to the Emergency Department and triage nurse.   

## 2017-06-03 NOTE — Progress Notes (Signed)
Patient Care Team: Molli Posey, MD as PCP - General (Obstetrics and Gynecology)  DIAGNOSIS:  Encounter Diagnosis  Name Primary?  . Malignant neoplasm of lower-inner quadrant of left breast in female, estrogen receptor positive ([place])     SUMMARY OF ONCOLOGIC HISTORY:   Malignant neoplasm of lower-inner quadrant of left breast in female, estrogen receptor positive ([place])   02/28/2017 Initial Diagnosis    Palpable left breast masses 2.5 cm at 7:00 and another 2.5 cm at 6:00 1.9 cm apart biopsy: IDC grade 2 with DCISER 100%, PR 0%, Ki-67 20%, HER-2 positiveratio 2.08, axilla negative; in addition indeterminate calcifications medially and laterally,T2 N0 stage 2A clinical stage AJCC 8      04/04/2017 Surgery    Left mastectomy: IDC grade 3, 3.8 cm, intermediate grade DCIS, invasive cancer comes 0.1 cm to posterior margin and less than 0.1 cm to anterior inferior margin at 0/13 lymph nodes, ER 100%, PR 0%, HER-2 positive ratio 2.08 T2 N0 stage IIb      05/20/2017 -  Chemotherapy    Taxol Herceptin weekly x12 followed by Herceptin maintenance every 3 weeks for 1 year (patient refused [facility])        CHIEF COMPLIANT: Cycle 3 Taxol Herceptin  INTERVAL HISTORY: Adriana Jones is a 62 year old with above-mentioned history of left breast cancer today is cycle 3 of adjuvant chemotherapy with Taxol and Herceptin.  She appears to be tolerating the treatment extremely well.  She has no nausea vomiting.  She does feel tired on the day of treatment.  She denies neuropathy.  REVIEW OF SYSTEMS:   Constitutional: Denies fevers, chills or abnormal weight loss Eyes: Denies blurriness of vision Ears, nose, mouth, throat, and face: Denies mucositis or sore throat Respiratory: Denies cough, dyspnea or wheezes Cardiovascular: Denies palpitation, chest discomfort Gastrointestinal:  Denies nausea, heartburn or change in bowel habits Skin: Denies abnormal skin rashes Lymphatics: Denies new lymphadenopathy or  easy bruising Neurological:Denies numbness, tingling or new weaknesses Behavioral/Psych: Mood is stable, no new changes  Extremities: No lower extremity edema  All other systems were reviewed with the patient and are negative.  I have reviewed the past medical history, past surgical history, social history and family history with the patient and they are unchanged from previous note.  ALLERGIES:  is allergic to amoxicillin and sulfa antibiotics.  MEDICATIONS:  Current Outpatient Medications  Medication Sig Dispense Refill  . acetaminophen (TYLENOL) 500 MG tablet Take 250 mg by mouth every 8 (eight) hours as needed for mild pain or headache.     . Ascorbic Acid (VITAMIN C) 1000 MG tablet Take 1,000 mg by mouth daily.    . cholecalciferol 2000 units tablet Take 2 tablets (4,000 Units total) by mouth daily.    Marland Kitchen HYDROcodone-acetaminophen (NORCO/VICODIN) 5-325 MG tablet Take 1-2 tablets every 6 (six) hours as needed by mouth for moderate pain or severe pain. 20 tablet 0  . lidocaine-prilocaine (EMLA) cream Apply to affected area once 30 g 3  . ondansetron (ZOFRAN) 8 MG tablet Take 1 tablet (8 mg total) by mouth 2 (two) times daily as needed (Nausea or vomiting). 30 tablet 1  . prochlorperazine (COMPAZINE) 10 MG tablet Take 1 tablet (10 mg total) by mouth every 6 (six) hours as needed (Nausea or vomiting). 30 tablet 1   No current facility-administered medications for this visit.     PHYSICAL EXAMINATION: ECOG PERFORMANCE STATUS: 1 - Symptomatic but completely ambulatory  Vitals:   06/03/17 1100  BP: (!) 193/82  Pulse:  66  Resp: 20  Temp: 98.2 F (36.8 C)  SpO2: 100%   Filed Weights   06/03/17 1100  Weight: 141 lb (64 kg)    GENERAL:alert, no distress and comfortable SKIN: skin color, texture, turgor are normal, no rashes or significant lesions EYES: normal, Conjunctiva are pink and non-injected, sclera clear OROPHARYNX:no exudate, no erythema and lips, buccal mucosa, and  tongue normal  NECK: supple, thyroid normal size, non-tender, without nodularity LYMPH:  no palpable lymphadenopathy in the cervical, axillary or inguinal LUNGS: clear to auscultation and percussion with normal breathing effort HEART: regular rate & rhythm and no murmurs and no lower extremity edema ABDOMEN:abdomen soft, non-tender and normal bowel sounds MUSCULOSKELETAL:no cyanosis of digits and no clubbing  NEURO: alert & oriented x 3 with fluent speech, no focal motor/sensory deficits EXTREMITIES: No lower extremity edema  LABORATORY DATA:  I have reviewed the data as listed CMP Latest Ref Rng & Units 06/03/2017 05/27/2017 05/20/2017  Glucose 70 - 140 mg/dL 94 105 100  BUN 7 - 26 mg/dL 11 11.3 17.9  Creatinine 0.60 - 1.10 mg/dL 0.66 0.7 0.7  Sodium 136 - 145 mmol/L 141 138 140  Potassium 3.3 - 4.7 mmol/L 4.3 4.3 4.1  Chloride 98 - 109 mmol/L 111(H) - -  CO2 22 - 29 mmol/L '24 24 22  '$ Calcium 8.4 - 10.4 mg/dL 8.7 9.1 8.8  Total Protein 6.4 - 8.3 g/dL 6.7 7.0 7.0  Total Bilirubin 0.2 - 1.2 mg/dL 0.3 0.55 0.30  Alkaline Phos 40 - 150 U/L 86 95 89  AST 5 - 34 U/L '16 22 22  '$ ALT 0 - 55 U/L 23 40 24    Lab Results  Component Value Date   WBC 3.4 (L) 06/03/2017   HGB 11.9 06/03/2017   HCT 35.3 06/03/2017   MCV 91.0 06/03/2017   PLT 265 06/03/2017   NEUTROABS 2.2 06/03/2017    ASSESSMENT & PLAN:  Malignant neoplasm of lower-inner quadrant of left breast in female, estrogen receptor positive ([facility]) 04/04/2017:Left mastectomy: IDC grade 3, 3.8 cm, intermediate grade DCIS, invasive cancer comes 0.1 cm to posterior margin and less than 0.1 cm to anterior inferior margin at 0/13 lymph nodes, ER 100%, PR 0%, HER-2 positive ratio 2.08 T2 N0 stage IIb  Treatment plan: 1.Adjuvant chemotherapywithTH followed by Herceptin maintenance for 1 year. 2. Adjuvant radiation therapyif necessary based upon final pathology 3. Adjuvant antiestrogen  therapy --------------------------------------------------------- Current treatment: Cycle 3 day 1 Taxol Herceptin weekly  Patient plans to use marijuana daily to decrease adverse effects of chemo ECHO 03/28/2017: EF 60-65%   Chemo toxicities: Patient gets tired on the day of chemotherapy but otherwise she has not had any nausea vomiting. We are watching closely for neuropathy symptoms.  RTC in 2 week for follow-up with me and weekly for chemo   I spent 25 minutes talking to the patient of which more than half was spent in counseling and coordination of care.  No orders of the defined types were placed in this encounter.  The patient has a good understanding of the overall plan. she agrees with it. she will call with any problems that may develop before the next visit here.   Harriette Ohara, MD 06/03/17

## 2017-06-10 ENCOUNTER — Other Ambulatory Visit: Payer: Self-pay | Admitting: Hematology and Oncology

## 2017-06-10 ENCOUNTER — Inpatient Hospital Stay: Payer: BLUE CROSS/BLUE SHIELD

## 2017-06-10 VITALS — BP 150/87 | HR 83 | Temp 98.9°F | Resp 16

## 2017-06-10 DIAGNOSIS — Z170 Estrogen receptor positive status [ER+]: Principal | ICD-10-CM

## 2017-06-10 DIAGNOSIS — C50312 Malignant neoplasm of lower-inner quadrant of left female breast: Secondary | ICD-10-CM

## 2017-06-10 LAB — COMPREHENSIVE METABOLIC PANEL
ALT: 27 U/L (ref 0–55)
AST: 19 U/L (ref 5–34)
Albumin: 3.9 g/dL (ref 3.5–5.0)
Alkaline Phosphatase: 91 U/L (ref 40–150)
Anion gap: 9 (ref 3–11)
BILIRUBIN TOTAL: 0.5 mg/dL (ref 0.2–1.2)
BUN: 14 mg/dL (ref 7–26)
CO2: 24 mmol/L (ref 22–29)
CREATININE: 0.65 mg/dL (ref 0.60–1.10)
Calcium: 9.2 mg/dL (ref 8.4–10.4)
Chloride: 108 mmol/L (ref 98–109)
Glucose, Bld: 97 mg/dL (ref 70–140)
Potassium: 4 mmol/L (ref 3.3–4.7)
Sodium: 141 mmol/L (ref 136–145)
TOTAL PROTEIN: 7.1 g/dL (ref 6.4–8.3)

## 2017-06-10 LAB — CBC WITH DIFFERENTIAL/PLATELET
Basophils Absolute: 0 10*3/uL (ref 0.0–0.1)
Basophils Relative: 1 %
EOS ABS: 0 10*3/uL (ref 0.0–0.5)
EOS PCT: 1 %
HCT: 36.6 % (ref 34.8–46.6)
HEMOGLOBIN: 12 g/dL (ref 11.6–15.9)
Lymphocytes Relative: 34 %
Lymphs Abs: 0.8 10*3/uL — ABNORMAL LOW (ref 0.9–3.3)
MCH: 29.8 pg (ref 25.1–34.0)
MCHC: 32.9 g/dL (ref 31.5–36.0)
MCV: 90.7 fL (ref 79.5–101.0)
Monocytes Absolute: 0.3 10*3/uL (ref 0.1–0.9)
Monocytes Relative: 13 %
NEUTROS PCT: 51 %
Neutro Abs: 1.3 10*3/uL — ABNORMAL LOW (ref 1.5–6.5)
PLATELETS: 307 10*3/uL (ref 145–400)
RBC: 4.03 MIL/uL (ref 3.70–5.45)
RDW: 13.6 % (ref 11.2–16.1)
WBC: 2.5 10*3/uL — AB (ref 3.9–10.3)

## 2017-06-10 MED ORDER — DIPHENHYDRAMINE HCL 50 MG/ML IJ SOLN
INTRAMUSCULAR | Status: AC
Start: 2017-06-10 — End: 2017-06-10
  Filled 2017-06-10: qty 1

## 2017-06-10 MED ORDER — TRASTUZUMAB CHEMO 150 MG IV SOLR
2.0000 mg/kg | Freq: Once | INTRAVENOUS | Status: AC
  Administered 2017-06-10: 126 mg via INTRAVENOUS
  Filled 2017-06-10: qty 6

## 2017-06-10 MED ORDER — DIPHENHYDRAMINE HCL 50 MG/ML IJ SOLN
25.0000 mg | Freq: Once | INTRAMUSCULAR | Status: AC
  Administered 2017-06-10: 25 mg via INTRAVENOUS

## 2017-06-10 MED ORDER — SODIUM CHLORIDE 0.9 % IV SOLN
60.0000 mg/m2 | Freq: Once | INTRAVENOUS | Status: AC
  Administered 2017-06-10: 102 mg via INTRAVENOUS
  Filled 2017-06-10: qty 17

## 2017-06-10 MED ORDER — SODIUM CHLORIDE 0.9% FLUSH
10.0000 mL | INTRAVENOUS | Status: DC | PRN
  Administered 2017-06-10: 10 mL
  Filled 2017-06-10: qty 10

## 2017-06-10 MED ORDER — ACETAMINOPHEN 325 MG PO TABS
650.0000 mg | ORAL_TABLET | Freq: Once | ORAL | Status: AC
  Administered 2017-06-10: 650 mg via ORAL

## 2017-06-10 MED ORDER — SODIUM CHLORIDE 0.9 % IV SOLN
Freq: Once | INTRAVENOUS | Status: AC
  Administered 2017-06-10: 11:00:00 via INTRAVENOUS

## 2017-06-10 MED ORDER — HEPARIN SOD (PORK) LOCK FLUSH 100 UNIT/ML IV SOLN
500.0000 [IU] | Freq: Once | INTRAVENOUS | Status: AC | PRN
  Administered 2017-06-10: 500 [IU]
  Filled 2017-06-10: qty 5

## 2017-06-10 MED ORDER — FAMOTIDINE IN NACL 20-0.9 MG/50ML-% IV SOLN
20.0000 mg | Freq: Once | INTRAVENOUS | Status: AC
  Administered 2017-06-10: 20 mg via INTRAVENOUS

## 2017-06-10 MED ORDER — ACETAMINOPHEN 325 MG PO TABS
ORAL_TABLET | ORAL | Status: AC
  Filled 2017-06-10: qty 2

## 2017-06-10 MED ORDER — FAMOTIDINE IN NACL 20-0.9 MG/50ML-% IV SOLN
INTRAVENOUS | Status: AC
  Filled 2017-06-10: qty 50

## 2017-06-10 NOTE — Patient Instructions (Signed)
[facility] Discharge Instructions for Patients Receiving Chemotherapy  Today you received the following chemotherapy agents:  Herceptin and Taxol.  To help prevent nausea and vomiting after your treatment, we encourage you to take your nausea medication as directed.   If you develop nausea and vomiting that is not controlled by your nausea medication, call the clinic.   BELOW ARE SYMPTOMS THAT SHOULD BE REPORTED IMMEDIATELY:  *FEVER GREATER THAN 100.5 F  *CHILLS WITH OR WITHOUT FEVER  NAUSEA AND VOMITING THAT IS NOT CONTROLLED WITH YOUR NAUSEA MEDICATION  *UNUSUAL SHORTNESS OF BREATH  *UNUSUAL BRUISING OR BLEEDING  TENDERNESS IN MOUTH AND THROAT WITH OR WITHOUT PRESENCE OF ULCERS  *URINARY PROBLEMS  *BOWEL PROBLEMS  UNUSUAL RASH Items with * indicate a potential emergency and should be followed up as soon as possible.  Feel free to call the clinic should you have any questions or concerns. The clinic phone number is [phone].  Please show the CHEMO ALERT CARD at check-in to the Emergency Department and triage nurse.   

## 2017-06-10 NOTE — Progress Notes (Signed)
Because the patient has ANC of 1.3, I reduce the dosage of Taxol. Discontinue dexamethasone

## 2017-06-10 NOTE — Progress Notes (Signed)
Dr. Lindi Adie advised ok to proceed with treatment with current [place]. Will dose-reduce paclitaxel.

## 2017-06-17 ENCOUNTER — Inpatient Hospital Stay: Payer: BLUE CROSS/BLUE SHIELD

## 2017-06-17 ENCOUNTER — Telehealth: Payer: Self-pay | Admitting: Adult Health

## 2017-06-17 ENCOUNTER — Inpatient Hospital Stay (HOSPITAL_BASED_OUTPATIENT_CLINIC_OR_DEPARTMENT_OTHER): Payer: BLUE CROSS/BLUE SHIELD | Admitting: Adult Health

## 2017-06-17 ENCOUNTER — Encounter: Payer: Self-pay | Admitting: Adult Health

## 2017-06-17 VITALS — BP 176/99 | HR 85 | Temp 98.0°F | Resp 20 | Ht 64.0 in | Wt 139.2 lb

## 2017-06-17 VITALS — BP 133/88 | HR 77

## 2017-06-17 DIAGNOSIS — C50312 Malignant neoplasm of lower-inner quadrant of left female breast: Secondary | ICD-10-CM

## 2017-06-17 DIAGNOSIS — Z170 Estrogen receptor positive status [ER+]: Principal | ICD-10-CM

## 2017-06-17 DIAGNOSIS — Z9012 Acquired absence of left breast and nipple: Secondary | ICD-10-CM

## 2017-06-17 DIAGNOSIS — Z95828 Presence of other vascular implants and grafts: Secondary | ICD-10-CM

## 2017-06-17 LAB — COMPREHENSIVE METABOLIC PANEL
ALT: 18 U/L (ref 0–55)
ANION GAP: 9 (ref 3–11)
AST: 17 U/L (ref 5–34)
Albumin: 4.2 g/dL (ref 3.5–5.0)
Alkaline Phosphatase: 96 U/L (ref 40–150)
BUN: 17 mg/dL (ref 7–26)
CHLORIDE: 106 mmol/L (ref 98–109)
CO2: 25 mmol/L (ref 22–29)
Calcium: 9.6 mg/dL (ref 8.4–10.4)
Creatinine, Ser: 0.71 mg/dL (ref 0.60–1.10)
GFR calc non Af Amer: >60 mL/min (ref >60)
GLUCOSE: 95 mg/dL (ref 70–140)
POTASSIUM: 4.2 mmol/L (ref 3.3–4.7)
SODIUM: 140 mmol/L (ref 136–145)
Total Bilirubin: 0.4 mg/dL (ref 0.2–1.2)
Total Protein: 7.3 g/dL (ref 6.4–8.3)

## 2017-06-17 LAB — CBC WITH DIFFERENTIAL/PLATELET
BASOS PCT: 2 %
Basophils Absolute: 0.1 10*3/uL (ref 0.0–0.1)
Eosinophils Absolute: 0 10*3/uL (ref 0.0–0.5)
Eosinophils Relative: 1 %
HEMATOCRIT: 37.9 % (ref 34.8–46.6)
HEMOGLOBIN: 12.6 g/dL (ref 11.6–15.9)
LYMPHS ABS: 1 10*3/uL (ref 0.9–3.3)
LYMPHS PCT: 32 %
MCH: 30.3 pg (ref 25.1–34.0)
MCHC: 33.2 g/dL (ref 31.5–36.0)
MCV: 91.4 fL (ref 79.5–101.0)
MONOS PCT: 14 %
Monocytes Absolute: 0.4 10*3/uL (ref 0.1–0.9)
NEUTROS ABS: 1.6 10*3/uL (ref 1.5–6.5)
NEUTROS PCT: 51 %
Platelets: 285 10*3/uL (ref 145–400)
RBC: 4.15 MIL/uL (ref 3.70–5.45)
RDW: 13.9 % (ref 11.2–16.1)
WBC: 3 10*3/uL — ABNORMAL LOW (ref 3.9–10.3)

## 2017-06-17 MED ORDER — FAMOTIDINE IN NACL 20-0.9 MG/50ML-% IV SOLN
INTRAVENOUS | Status: AC
  Filled 2017-06-17: qty 50

## 2017-06-17 MED ORDER — ACETAMINOPHEN 325 MG PO TABS
650.0000 mg | ORAL_TABLET | Freq: Once | ORAL | Status: AC
  Administered 2017-06-17: 650 mg via ORAL

## 2017-06-17 MED ORDER — SODIUM CHLORIDE 0.9 % IV SOLN
Freq: Once | INTRAVENOUS | Status: AC
  Administered 2017-06-17: 12:00:00 via INTRAVENOUS

## 2017-06-17 MED ORDER — DIPHENHYDRAMINE HCL 50 MG/ML IJ SOLN
INTRAMUSCULAR | Status: AC
  Filled 2017-06-17: qty 1

## 2017-06-17 MED ORDER — SODIUM CHLORIDE 0.9 % IV SOLN
60.0000 mg/m2 | Freq: Once | INTRAVENOUS | Status: AC
  Administered 2017-06-17: 102 mg via INTRAVENOUS
  Filled 2017-06-17: qty 17

## 2017-06-17 MED ORDER — FAMOTIDINE IN NACL 20-0.9 MG/50ML-% IV SOLN
20.0000 mg | Freq: Once | INTRAVENOUS | Status: AC
  Administered 2017-06-17: 20 mg via INTRAVENOUS

## 2017-06-17 MED ORDER — HEPARIN SOD (PORK) LOCK FLUSH 100 UNIT/ML IV SOLN
500.0000 [IU] | Freq: Once | INTRAVENOUS | Status: AC | PRN
  Administered 2017-06-17: 500 [IU]
  Filled 2017-06-17: qty 5

## 2017-06-17 MED ORDER — DIPHENHYDRAMINE HCL 50 MG/ML IJ SOLN
25.0000 mg | Freq: Once | INTRAMUSCULAR | Status: AC
  Administered 2017-06-17: 25 mg via INTRAVENOUS

## 2017-06-17 MED ORDER — SODIUM CHLORIDE 0.9% FLUSH
10.0000 mL | INTRAVENOUS | Status: DC | PRN
  Administered 2017-06-17: 10 mL via INTRAVENOUS
  Filled 2017-06-17: qty 10

## 2017-06-17 MED ORDER — SODIUM CHLORIDE 0.9% FLUSH
10.0000 mL | INTRAVENOUS | Status: DC | PRN
  Administered 2017-06-17: 10 mL
  Filled 2017-06-17: qty 10

## 2017-06-17 MED ORDER — ACETAMINOPHEN 325 MG PO TABS
ORAL_TABLET | ORAL | Status: AC
  Filled 2017-06-17: qty 2

## 2017-06-17 MED ORDER — TRASTUZUMAB CHEMO 150 MG IV SOLR
2.0000 mg/kg | Freq: Once | INTRAVENOUS | Status: AC
  Administered 2017-06-17: 126 mg via INTRAVENOUS
  Filled 2017-06-17: qty 6

## 2017-06-17 NOTE — Progress Notes (Signed)
[facility] Follow up:    Molli Posey, MD [address] [place] [address] [place] [address]   DIAGNOSIS: Cancer Staging Malignant neoplasm of lower-inner quadrant of left breast in female, estrogen receptor positive ([place]) Staging form: Breast, AJCC 8th Edition - Clinical stage from 03/09/2017: Stage IIA (cT2, cN0, cM0, G2, ER: Positive, PR: Negative, HER2: Positive) - Unsigned - Pathologic: Stage IIA (pT2, pN0, cM0, G3, ER: Positive, PR: Negative, HER2: Positive) - Unsigned   SUMMARY OF ONCOLOGIC HISTORY:   Malignant neoplasm of lower-inner quadrant of left breast in female, estrogen receptor positive ([place])   02/28/2017 Initial Diagnosis    Palpable left breast masses 2.5 cm at 7:00 and another 2.5 cm at 6:00 1.9 cm apart biopsy: IDC grade 2 with DCISER 100%, PR 0%, Ki-67 20%, HER-2 positiveratio 2.08, axilla negative; in addition indeterminate calcifications medially and laterally,T2 N0 stage 2A clinical stage AJCC 8      04/04/2017 Surgery    Left mastectomy: IDC grade 3, 3.8 cm, intermediate grade DCIS, invasive cancer comes 0.1 cm to posterior margin and less than 0.1 cm to anterior inferior margin at 0/13 lymph nodes, ER 100%, PR 0%, HER-2 positive ratio 2.08 T2 N0 stage IIb      05/20/2017 -  Chemotherapy    Taxol Herceptin weekly x12 followed by Herceptin maintenance every 3 weeks for 1 year (patient refused [facility])        CURRENT THERAPY: Taxol Herceptin week 5  INTERVAL HISTORY: Tarri Guilfoil 62 y.o. female returns for evaluation prior to receiving her fifth week of adjuvant Taxol and Herceptin.  She tells me that she is on probiotics and she doesn't care what we think about it, she's just going to take them whether we like it or not.  She also says that if her WBC go down she refuses to take growth factor to help her WBCs.  She denies peripheral neuropathy.  She complained to the nurse about her appointment with me, because nobody told her, and she  thought she was going to see Dr. Lindi Adie today.   Patient Active Problem List   Diagnosis Date Noted  . Port-A-Cath in place 06/03/2017  . Malignant neoplasm of lower-inner quadrant of left breast in female, estrogen receptor positive ([place]) 03/03/2017    is allergic to amoxicillin and sulfa antibiotics.  MEDICAL HISTORY: Past Medical History:  Diagnosis Date  . Anxiety   . [facility])    breast  . Headache     SURGICAL HISTORY: Past Surgical History:  Procedure Laterality Date  . BREAST EXCISIONAL BIOPSY    . PORTACATH PLACEMENT N/A 04/04/2017   Procedure: INSERTION PORT-A-CATH;  Surgeon: Jovita Kussmaul, MD;  Location: [place];  Service: General;  Laterality: N/A;  . SIMPLE MASTECTOMY WITH AXILLARY SENTINEL NODE BIOPSY Left 04/04/2017   Procedure: LEFT MASTECTOMY WITH LEFT SENTINEL LYMPH NODE BIOPSY;  Surgeon: Jovita Kussmaul, MD;  Location: [place];  Service: General;  Laterality: Left;    SOCIAL HISTORY: Social History   Socioeconomic History  . Marital status: Married    Spouse name: Not on file  . Number of children: Not on file  . Years of education: Not on file  . Highest education level: Not on file  Social Needs  . Financial resource strain: Not on file  . Food insecurity - worry: Not on file  . Food insecurity - inability: Not on file  . Transportation needs - medical: Not on file  . Transportation  needs - non-medical: Not on file  Occupational History  . Not on file  Tobacco Use  . Smoking status: Former Smoker    Years: 3.00    Types: E-cigarettes    Start date: 08/07/2013  . Smokeless tobacco: Never Used  Substance and Sexual Activity  . Alcohol use: Yes    Comment: 4-10/wk  . Drug use: No  . Sexual activity: Not on file  Other Topics Concern  . Not on file  Social History Narrative  . Not on file    FAMILY HISTORY: No family history on file.  Review of Systems  Constitutional: Negative for appetite change, chills, fatigue, fever and  unexpected weight change.  HENT:   Negative for hearing loss, lump/mass and trouble swallowing.   Eyes: Negative for eye problems and icterus.  Respiratory: Negative for chest tightness, cough and shortness of breath.   Cardiovascular: Negative for chest pain, leg swelling and palpitations.  Gastrointestinal: Negative for abdominal distention, abdominal pain, constipation, diarrhea, nausea and vomiting.  Endocrine: Negative for hot flashes.  Skin: Negative for itching and rash.  Neurological: Negative for dizziness.  Hematological: Negative for adenopathy. Does not bruise/bleed easily.  Psychiatric/Behavioral: Negative for depression. The patient is not nervous/anxious.       PHYSICAL EXAMINATION  ECOG PERFORMANCE STATUS: 0 - Asymptomatic  Vitals:   06/17/17 1106  BP: (!) 176/99  Pulse: 85  Resp: 20  Temp: 98 F (36.7 C)  SpO2: 98%    Physical Exam  Constitutional: She is oriented to person, place, and time and well-developed, well-nourished, and in no distress.  HENT:  Head: Normocephalic and atraumatic.  Mouth/Throat: Oropharynx is clear and moist. No oropharyngeal exudate.  Eyes: Pupils are equal, round, and reactive to light. No scleral icterus.  Neck: Neck supple.  Cardiovascular: Normal rate, regular rhythm and normal heart sounds.  Pulmonary/Chest: Effort normal and breath sounds normal. No respiratory distress. She has no wheezes. She has no rales.  Abdominal: Soft. Bowel sounds are normal. She exhibits no distension. There is no tenderness. There is no rebound and no guarding.  Musculoskeletal: She exhibits no edema.  Lymphadenopathy:    She has no cervical adenopathy.  Neurological: She is alert and oriented to person, place, and time.  Skin: Skin is warm and dry.  Psychiatric: Mood and affect normal.    LABORATORY DATA:  CBC    Component Value Date/Time   WBC 3.0 (L) 06/17/2017 1027   RBC 4.15 06/17/2017 1027   HGB 12.6 06/17/2017 1027   HGB 12.3  05/27/2017 1130   HCT 37.9 06/17/2017 1027   HCT 37.1 05/27/2017 1130   PLT 285 06/17/2017 1027   PLT 229 05/27/2017 1130   MCV 91.4 06/17/2017 1027   MCV 90.9 05/27/2017 1130   MCH 30.3 06/17/2017 1027   MCHC 33.2 06/17/2017 1027   RDW 13.9 06/17/2017 1027   RDW 13.5 05/27/2017 1130   LYMPHSABS 1.0 06/17/2017 1027   LYMPHSABS 0.9 05/27/2017 1130   MONOABS 0.4 06/17/2017 1027   MONOABS 0.4 05/27/2017 1130   EOSABS 0.0 06/17/2017 1027   EOSABS 0.0 05/27/2017 1130   BASOSABS 0.1 06/17/2017 1027   BASOSABS 0.0 05/27/2017 1130    CMP     Component Value Date/Time   NA 140 06/17/2017 1027   NA 138 05/27/2017 1130   K 4.2 06/17/2017 1027   K 4.3 05/27/2017 1130   CL 106 06/17/2017 1027   CO2 25 06/17/2017 1027   CO2 24 05/27/2017 1130  GLUCOSE 95 06/17/2017 1027   GLUCOSE 105 05/27/2017 1130   BUN 17 06/17/2017 1027   BUN 11.3 05/27/2017 1130   CREATININE 0.71 06/17/2017 1027   CREATININE 0.7 05/27/2017 1130   CALCIUM 9.6 06/17/2017 1027   CALCIUM 9.1 05/27/2017 1130   PROT 7.3 06/17/2017 1027   PROT 7.0 05/27/2017 1130   ALBUMIN 4.2 06/17/2017 1027   ALBUMIN 3.9 05/27/2017 1130   AST 17 06/17/2017 1027   AST 22 05/27/2017 1130   ALT 18 06/17/2017 1027   ALT 40 05/27/2017 1130   ALKPHOS 96 06/17/2017 1027   ALKPHOS 95 05/27/2017 1130   BILITOT 0.4 06/17/2017 1027   BILITOT 0.55 05/27/2017 1130   GFRNONAA >60 06/17/2017 1027   GFRAA >60 06/17/2017 1027    ASSESSMENT and PLAN:   Malignant neoplasm of lower-inner quadrant of left breast in female, estrogen receptor positive ([place]) 04/04/2017:Left mastectomy: IDC grade 3, 3.8 cm, intermediate grade DCIS, invasive cancer comes 0.1 cm to posterior margin and less than 0.1 cm to anterior inferior margin at 0/13 lymph nodes, ER 100%, PR 0%, HER-2 positive ratio 2.08 T2 N0 stage IIb  Treatment plan: 1.Adjuvant chemotherapywithTH followed by Herceptin maintenance for 1 year. 2. Adjuvant radiation therapyif  necessary based upon final pathology 3. Adjuvant antiestrogen therapy --------------------------------------------------------- Current treatment: Cycle 5 day 1 Taxol Herceptin weekly  Patient plans to use marijuana daily to decrease adverse effects of chemo ECHO 03/28/2017: EF 60-65% Anti emetics were reviewed Patient taking probiotics, declines Neupogen if needed in the future.    Randye will proceed with treatment. Her labs are stable.  She will return weekly for taxol, herceptin, and will see myself or Dr. Lindi Adie with every other cycle.     All questions were answered. The patient knows to call the clinic with any problems, questions or concerns. We can certainly see the patient much sooner if necessary.  A total of (30) minutes of face-to-face time was spent with this patient with greater than 50% of that time in counseling and care-coordination.  This note was electronically signed. Scot Dock, NP 06/17/2017

## 2017-06-17 NOTE — Telephone Encounter (Signed)
No 1/25 los.  

## 2017-06-17 NOTE — Assessment & Plan Note (Addendum)
04/04/2017:Left mastectomy: IDC grade 3, 3.8 cm, intermediate grade DCIS, invasive cancer comes 0.1 cm to posterior margin and less than 0.1 cm to anterior inferior margin at 0/13 lymph nodes, ER 100%, PR 0%, HER-2 positive ratio 2.08 T2 N0 stage IIb  Treatment plan: 1.Adjuvant chemotherapywithTH followed by Herceptin maintenance for 1 year. 2. Adjuvant radiation therapyif necessary based upon final pathology 3. Adjuvant antiestrogen therapy --------------------------------------------------------- Current treatment: Cycle 5 day 1 Taxol Herceptin weekly  Patient plans to use marijuana daily to decrease adverse effects of chemo ECHO 03/28/2017: EF 60-65% Anti emetics were reviewed Patient taking probiotics, declines Neupogen if needed in the future.    Adriana Jones will proceed with treatment. Her labs are stable.  She will return weekly for taxol, herceptin, and will see myself or Dr. Lindi Adie with every other cycle.

## 2017-06-17 NOTE — Patient Instructions (Signed)
[facility] Discharge Instructions for Patients Receiving Chemotherapy  Today you received the following chemotherapy agents:  Herceptin and Taxol.  To help prevent nausea and vomiting after your treatment, we encourage you to take your nausea medication as directed.   If you develop nausea and vomiting that is not controlled by your nausea medication, call the clinic.   BELOW ARE SYMPTOMS THAT SHOULD BE REPORTED IMMEDIATELY:  *FEVER GREATER THAN 100.5 F  *CHILLS WITH OR WITHOUT FEVER  NAUSEA AND VOMITING THAT IS NOT CONTROLLED WITH YOUR NAUSEA MEDICATION  *UNUSUAL SHORTNESS OF BREATH  *UNUSUAL BRUISING OR BLEEDING  TENDERNESS IN MOUTH AND THROAT WITH OR WITHOUT PRESENCE OF ULCERS  *URINARY PROBLEMS  *BOWEL PROBLEMS  UNUSUAL RASH Items with * indicate a potential emergency and should be followed up as soon as possible.  Feel free to call the clinic should you have any questions or concerns. The clinic phone number is [phone].  Please show the CHEMO ALERT CARD at check-in to the Emergency Department and triage nurse.   

## 2017-06-24 ENCOUNTER — Inpatient Hospital Stay: Payer: BLUE CROSS/BLUE SHIELD

## 2017-06-24 ENCOUNTER — Inpatient Hospital Stay: Payer: BLUE CROSS/BLUE SHIELD | Attending: Hematology and Oncology

## 2017-06-24 VITALS — BP 176/98 | HR 81 | Temp 98.1°F | Resp 20

## 2017-06-24 DIAGNOSIS — C50312 Malignant neoplasm of lower-inner quadrant of left female breast: Secondary | ICD-10-CM

## 2017-06-24 DIAGNOSIS — Z5112 Encounter for antineoplastic immunotherapy: Secondary | ICD-10-CM | POA: Insufficient documentation

## 2017-06-24 DIAGNOSIS — Z170 Estrogen receptor positive status [ER+]: Principal | ICD-10-CM

## 2017-06-24 DIAGNOSIS — Z5111 Encounter for antineoplastic chemotherapy: Secondary | ICD-10-CM | POA: Diagnosis not present

## 2017-06-24 DIAGNOSIS — Z9012 Acquired absence of left breast and nipple: Secondary | ICD-10-CM | POA: Diagnosis not present

## 2017-06-24 DIAGNOSIS — Z95828 Presence of other vascular implants and grafts: Secondary | ICD-10-CM

## 2017-06-24 LAB — CBC WITH DIFFERENTIAL/PLATELET
Basophils Absolute: 0 10*3/uL (ref 0.0–0.1)
Basophils Relative: 1 %
EOS ABS: 0 10*3/uL (ref 0.0–0.5)
Eosinophils Relative: 1 %
HCT: 36.8 % (ref 34.8–46.6)
HEMOGLOBIN: 12.1 g/dL (ref 11.6–15.9)
LYMPHS ABS: 1.1 10*3/uL (ref 0.9–3.3)
Lymphocytes Relative: 31 %
MCH: 30.6 pg (ref 25.1–34.0)
MCHC: 32.9 g/dL (ref 31.5–36.0)
MCV: 93.2 fL (ref 79.5–101.0)
MONO ABS: 0.3 10*3/uL (ref 0.1–0.9)
MONOS PCT: 8 %
NEUTROS PCT: 59 %
Neutro Abs: 2.2 10*3/uL (ref 1.5–6.5)
Platelets: 274 10*3/uL (ref 145–400)
RBC: 3.95 MIL/uL (ref 3.70–5.45)
RDW: 13.7 % (ref 11.2–14.5)
WBC: 3.7 10*3/uL — ABNORMAL LOW (ref 3.9–10.3)

## 2017-06-24 LAB — COMPREHENSIVE METABOLIC PANEL
ALBUMIN: 3.9 g/dL (ref 3.5–5.0)
ALK PHOS: 96 U/L (ref 40–150)
ALT: 19 U/L (ref 0–55)
AST: 18 U/L (ref 5–34)
Anion gap: 8 (ref 3–11)
BUN: 14 mg/dL (ref 7–26)
CALCIUM: 9.2 mg/dL (ref 8.4–10.4)
CHLORIDE: 107 mmol/L (ref 98–109)
CO2: 26 mmol/L (ref 22–29)
CREATININE: 0.65 mg/dL (ref 0.60–1.10)
GFR calc Af Amer: >60 mL/min (ref >60)
GFR calc non Af Amer: >60 mL/min (ref >60)
GLUCOSE: 94 mg/dL (ref 70–140)
Potassium: 4.2 mmol/L (ref 3.5–5.1)
SODIUM: 141 mmol/L (ref 136–145)
Total Bilirubin: 0.4 mg/dL (ref 0.2–1.2)
Total Protein: 7.1 g/dL (ref 6.4–8.3)

## 2017-06-24 MED ORDER — ACETAMINOPHEN 325 MG PO TABS
650.0000 mg | ORAL_TABLET | Freq: Once | ORAL | Status: AC
  Administered 2017-06-24: 650 mg via ORAL

## 2017-06-24 MED ORDER — SODIUM CHLORIDE 0.9% FLUSH
10.0000 mL | INTRAVENOUS | Status: DC | PRN
  Administered 2017-06-24: 10 mL
  Filled 2017-06-24: qty 10

## 2017-06-24 MED ORDER — FAMOTIDINE IN NACL 20-0.9 MG/50ML-% IV SOLN
20.0000 mg | Freq: Once | INTRAVENOUS | Status: AC
  Administered 2017-06-24: 20 mg via INTRAVENOUS

## 2017-06-24 MED ORDER — SODIUM CHLORIDE 0.9% FLUSH
10.0000 mL | INTRAVENOUS | Status: DC | PRN
  Administered 2017-06-24: 10 mL via INTRAVENOUS
  Filled 2017-06-24: qty 10

## 2017-06-24 MED ORDER — FAMOTIDINE IN NACL 20-0.9 MG/50ML-% IV SOLN
INTRAVENOUS | Status: AC
  Filled 2017-06-24: qty 50

## 2017-06-24 MED ORDER — DIPHENHYDRAMINE HCL 50 MG/ML IJ SOLN
INTRAMUSCULAR | Status: AC
  Filled 2017-06-24: qty 1

## 2017-06-24 MED ORDER — SODIUM CHLORIDE 0.9 % IV SOLN
60.0000 mg/m2 | Freq: Once | INTRAVENOUS | Status: AC
  Administered 2017-06-24: 102 mg via INTRAVENOUS
  Filled 2017-06-24: qty 17

## 2017-06-24 MED ORDER — HEPARIN SOD (PORK) LOCK FLUSH 100 UNIT/ML IV SOLN
500.0000 [IU] | Freq: Once | INTRAVENOUS | Status: AC | PRN
  Administered 2017-06-24: 500 [IU]
  Filled 2017-06-24: qty 5

## 2017-06-24 MED ORDER — SODIUM CHLORIDE 0.9 % IV SOLN
Freq: Once | INTRAVENOUS | Status: AC
  Administered 2017-06-24: 13:00:00 via INTRAVENOUS

## 2017-06-24 MED ORDER — DIPHENHYDRAMINE HCL 50 MG/ML IJ SOLN
25.0000 mg | Freq: Once | INTRAMUSCULAR | Status: AC
  Administered 2017-06-24: 25 mg via INTRAVENOUS

## 2017-06-24 MED ORDER — ACETAMINOPHEN 325 MG PO TABS
ORAL_TABLET | ORAL | Status: AC
  Filled 2017-06-24: qty 2

## 2017-06-24 MED ORDER — SODIUM CHLORIDE 0.9 % IV SOLN
2.0000 mg/kg | Freq: Once | INTRAVENOUS | Status: AC
  Administered 2017-06-24: 126 mg via INTRAVENOUS
  Filled 2017-06-24: qty 6

## 2017-06-24 NOTE — Patient Instructions (Signed)
[facility] Discharge Instructions for Patients Receiving Chemotherapy  Today you received the following chemotherapy agents:  Herceptin and Taxol.  To help prevent nausea and vomiting after your treatment, we encourage you to take your nausea medication as directed.   If you develop nausea and vomiting that is not controlled by your nausea medication, call the clinic.   BELOW ARE SYMPTOMS THAT SHOULD BE REPORTED IMMEDIATELY:  *FEVER GREATER THAN 100.5 F  *CHILLS WITH OR WITHOUT FEVER  NAUSEA AND VOMITING THAT IS NOT CONTROLLED WITH YOUR NAUSEA MEDICATION  *UNUSUAL SHORTNESS OF BREATH  *UNUSUAL BRUISING OR BLEEDING  TENDERNESS IN MOUTH AND THROAT WITH OR WITHOUT PRESENCE OF ULCERS  *URINARY PROBLEMS  *BOWEL PROBLEMS  UNUSUAL RASH Items with * indicate a potential emergency and should be followed up as soon as possible.  Feel free to call the clinic should you have any questions or concerns. The clinic phone number is [phone].  Please show the CHEMO ALERT CARD at check-in to the Emergency Department and triage nurse.   

## 2017-06-30 NOTE — Progress Notes (Signed)
[facility] Follow up:    Adriana Posey, MD [address] [place] [address] [place] [address]   DIAGNOSIS: Cancer Staging Malignant neoplasm of lower-inner quadrant of left breast in female, estrogen receptor positive ([place]) Staging form: Breast, AJCC 8th Edition - Clinical stage from 03/09/2017: Stage IIA (cT2, cN0, cM0, G2, ER: Positive, PR: Negative, HER2: Positive) - Unsigned - Pathologic: Stage IIA (pT2, pN0, cM0, G3, ER: Positive, PR: Negative, HER2: Positive) - Unsigned   SUMMARY OF ONCOLOGIC HISTORY:   Malignant neoplasm of lower-inner quadrant of left breast in female, estrogen receptor positive ([place])   02/28/2017 Initial Diagnosis    Palpable left breast masses 2.5 cm at 7:00 and another 2.5 cm at 6:00 1.9 cm apart biopsy: IDC grade 2 with DCISER 100%, PR 0%, Ki-67 20%, HER-2 positiveratio 2.08, axilla negative; in addition indeterminate calcifications medially and laterally,T2 N0 stage 2A clinical stage AJCC 8      04/04/2017 Surgery    Left mastectomy: IDC grade 3, 3.8 cm, intermediate grade DCIS, invasive cancer comes 0.1 cm to posterior margin and less than 0.1 cm to anterior inferior margin at 0/13 lymph nodes, ER 100%, PR 0%, HER-2 positive ratio 2.08 T2 N0 stage IIb      05/20/2017 -  Chemotherapy    Taxol Herceptin weekly x12 followed by Herceptin maintenance every 3 weeks for 1 year (patient refused [place])        CURRENT THERAPY: Taxol/Herceptin  INTERVAL HISTORY: Adriana Jones 62 y.o. female returns for evaluation prior to receiving Taxol/Herceptin.  Adriana Jones is doing well today.  She denies any issues.  She says her blood pressure always seems to be elevated when she comes in for her treatments.  She denies any other issues.  It does appear that after taking a bit to relax, her blood pressure does decline.  She notes she cannot eat as much spicy food as she used to.  She isn't happy about this.     Patient Active Problem List   Diagnosis Date  Noted  . Port-A-Cath in place 06/03/2017  . Malignant neoplasm of lower-inner quadrant of left breast in female, estrogen receptor positive ([place]) 03/03/2017    is allergic to amoxicillin and sulfa antibiotics.  MEDICAL HISTORY: Past Medical History:  Diagnosis Date  . Anxiety   . Cancer [facility])    breast  . Headache     SURGICAL HISTORY: Past Surgical History:  Procedure Laterality Date  . BREAST EXCISIONAL BIOPSY    . PORTACATH PLACEMENT N/A 04/04/2017   Procedure: INSERTION PORT-A-CATH;  Surgeon: Jovita Kussmaul, MD;  Location: [place];  Service: General;  Laterality: N/A;  . SIMPLE MASTECTOMY WITH AXILLARY SENTINEL NODE BIOPSY Left 04/04/2017   Procedure: LEFT MASTECTOMY WITH LEFT SENTINEL LYMPH NODE BIOPSY;  Surgeon: Jovita Kussmaul, MD;  Location: [place];  Service: General;  Laterality: Left;    SOCIAL HISTORY: Social History   Socioeconomic History  . Marital status: Married    Spouse name: Not on file  . Number of children: Not on file  . Years of education: Not on file  . Highest education level: Not on file  Social Needs  . Financial resource strain: Not on file  . Food insecurity - worry: Not on file  . Food insecurity - inability: Not on file  . Transportation needs - medical: Not on file  . Transportation needs - non-medical: Not on file  Occupational History  . Not on file  Tobacco Use  .  Smoking status: Former Smoker    Years: 3.00    Types: E-cigarettes    Start date: 08/07/2013  . Smokeless tobacco: Never Used  Substance and Sexual Activity  . Alcohol use: Yes    Comment: 4-10/wk  . Drug use: No  . Sexual activity: Not on file  Other Topics Concern  . Not on file  Social History Narrative  . Not on file    FAMILY HISTORY: History reviewed. No pertinent family history.  Review of Systems  Constitutional: Negative for appetite change, chills, fatigue, fever and unexpected weight change.  HENT:   Negative for hearing loss and lump/mass.   Eyes:  Negative for eye problems and icterus.  Respiratory: Negative for chest tightness, cough and shortness of breath.   Cardiovascular: Negative for chest pain, leg swelling and palpitations.  Gastrointestinal: Negative for abdominal distention, abdominal pain, constipation, diarrhea, nausea and vomiting.  Endocrine: Negative for hot flashes.  Skin: Negative for itching.  Neurological: Negative for dizziness, extremity weakness, headaches and numbness.  Hematological: Negative for adenopathy. Does not bruise/bleed easily.  Psychiatric/Behavioral: Negative for depression. The patient is not nervous/anxious.       PHYSICAL EXAMINATION  ECOG PERFORMANCE STATUS: 1 - Symptomatic but completely ambulatory  Vitals:   07/01/17 1129  BP: (!) 200/89  Pulse: 75  Resp: 17  Temp: 98.4 F (36.9 C)  SpO2: 100%    Physical Exam  Constitutional: She is oriented to person, place, and time and well-developed, well-nourished, and in no distress.  HENT:  Head: Normocephalic and atraumatic.  Mouth/Throat: Oropharynx is clear and moist. No oropharyngeal exudate.  Eyes: Pupils are equal, round, and reactive to light. No scleral icterus.  Neck: Neck supple.  Cardiovascular: Normal rate, regular rhythm and normal heart sounds.  Pulmonary/Chest: Effort normal and breath sounds normal. No respiratory distress. She has no wheezes. She has no rales.  Abdominal: Soft. Bowel sounds are normal. She exhibits no distension and no mass. There is no tenderness. There is no rebound and no guarding.  Musculoskeletal: She exhibits no edema.  Lymphadenopathy:    She has no cervical adenopathy.  Neurological: She is alert and oriented to person, place, and time.  Skin: Skin is warm and dry. No rash noted. No erythema.  Psychiatric: Mood and affect normal.    LABORATORY DATA:  CBC    Component Value Date/Time   WBC 4.2 07/01/2017 1028   RBC 3.87 07/01/2017 1028   HGB 11.7 07/01/2017 1028   HGB 12.3 05/27/2017  1130   HCT 35.5 07/01/2017 1028   HCT 37.1 05/27/2017 1130   PLT 276 07/01/2017 1028   PLT 229 05/27/2017 1130   MCV 91.7 07/01/2017 1028   MCV 90.9 05/27/2017 1130   MCH 30.2 07/01/2017 1028   MCHC 32.9 07/01/2017 1028   RDW 14.4 07/01/2017 1028   RDW 13.5 05/27/2017 1130   LYMPHSABS 1.0 07/01/2017 1028   LYMPHSABS 0.9 05/27/2017 1130   MONOABS 0.4 07/01/2017 1028   MONOABS 0.4 05/27/2017 1130   EOSABS 0.0 07/01/2017 1028   EOSABS 0.0 05/27/2017 1130   BASOSABS 0.1 07/01/2017 1028   BASOSABS 0.0 05/27/2017 1130    CMP     Component Value Date/Time   NA 141 07/01/2017 1028   NA 138 05/27/2017 1130   K 4.5 07/01/2017 1028   K 4.3 05/27/2017 1130   CL 109 07/01/2017 1028   CO2 25 07/01/2017 1028   CO2 24 05/27/2017 1130   GLUCOSE 94 07/01/2017 1028  GLUCOSE 105 05/27/2017 1130   BUN 11 07/01/2017 1028   BUN 11.3 05/27/2017 1130   CREATININE 0.67 07/01/2017 1028   CREATININE 0.7 05/27/2017 1130   CALCIUM 9.0 07/01/2017 1028   CALCIUM 9.1 05/27/2017 1130   PROT 6.7 07/01/2017 1028   PROT 7.0 05/27/2017 1130   ALBUMIN 3.8 07/01/2017 1028   ALBUMIN 3.9 05/27/2017 1130   AST 14 07/01/2017 1028   AST 22 05/27/2017 1130   ALT 16 07/01/2017 1028   ALT 40 05/27/2017 1130   ALKPHOS 82 07/01/2017 1028   ALKPHOS 95 05/27/2017 1130   BILITOT 0.3 07/01/2017 1028   BILITOT 0.55 05/27/2017 1130   GFRNONAA >60 07/01/2017 1028   GFRAA >60 07/01/2017 1028      ASSESSMENT and THERAPY PLAN:   Malignant neoplasm of lower-inner quadrant of left breast in female, estrogen receptor positive ([place]) 04/04/2017:Left mastectomy: IDC grade 3, 3.8 cm, intermediate grade DCIS, invasive cancer comes 0.1 cm to posterior margin and less than 0.1 cm to anterior inferior margin at 0/13 lymph nodes, ER 100%, PR 0%, HER-2 positive ratio 2.08 T2 N0 stage IIb  Treatment plan: 1.Adjuvant chemotherapywithTH followed by Herceptin maintenance for 1 year. 2. Adjuvant radiation therapyif  necessary based upon final pathology 3. Adjuvant antiestrogen therapy --------------------------------------------------------- Current treatment: Cycle 5 day 1 Taxol Herceptin weekly  Patient plans to use marijuana daily to decrease adverse effects of chemo ECHO 03/28/2017: EF 60-65% Anti emetics were reviewed Patient taking probiotics, declines Neupogen if needed in the future.   Declines a discussion about anti-hypertensives  Adriana Jones will proceed with treatment. Her labs are stable.  She will return weekly for taxol, herceptin, and will see myself or Dr. Lindi Adie with every other cycle.       All questions were answered. The patient knows to call the clinic with any problems, questions or concerns. We can certainly see the patient much sooner if necessary.  A total of (30) minutes of face-to-face time was spent with this patient with greater than 50% of that time in counseling and care-coordination.  This note was electronically signed. Scot Dock, NP 07/01/2017

## 2017-06-30 NOTE — Assessment & Plan Note (Addendum)
04/04/2017:Left mastectomy: IDC grade 3, 3.8 cm, intermediate grade DCIS, invasive cancer comes 0.1 cm to posterior margin and less than 0.1 cm to anterior inferior margin at 0/13 lymph nodes, ER 100%, PR 0%, HER-2 positive ratio 2.08 T2 N0 stage IIb  Treatment plan: 1.Adjuvant chemotherapywithTH followed by Herceptin maintenance for 1 year. 2. Adjuvant radiation therapyif necessary based upon final pathology 3. Adjuvant antiestrogen therapy --------------------------------------------------------- Current treatment: Cycle 5 day 1 Taxol Herceptin weekly  Patient plans to use marijuana daily to decrease adverse effects of chemo ECHO 03/28/2017: EF 60-65% Anti emetics were reviewed Patient taking probiotics, declines Neupogen if needed in the future.   Declines a discussion about anti-hypertensives  Adriana Jones will proceed with treatment. Her labs are stable.  She will return weekly for taxol, herceptin, and will see myself or Dr. Lindi Adie with every other cycle.

## 2017-07-01 ENCOUNTER — Inpatient Hospital Stay (HOSPITAL_BASED_OUTPATIENT_CLINIC_OR_DEPARTMENT_OTHER): Payer: BLUE CROSS/BLUE SHIELD | Admitting: Adult Health

## 2017-07-01 ENCOUNTER — Inpatient Hospital Stay: Payer: BLUE CROSS/BLUE SHIELD

## 2017-07-01 ENCOUNTER — Encounter: Payer: Self-pay | Admitting: Adult Health

## 2017-07-01 VITALS — BP 200/89 | HR 75 | Temp 98.4°F | Resp 17 | Ht 64.0 in | Wt 140.4 lb

## 2017-07-01 VITALS — BP 179/83

## 2017-07-01 DIAGNOSIS — Z95828 Presence of other vascular implants and grafts: Secondary | ICD-10-CM

## 2017-07-01 DIAGNOSIS — Z170 Estrogen receptor positive status [ER+]: Secondary | ICD-10-CM

## 2017-07-01 DIAGNOSIS — C50312 Malignant neoplasm of lower-inner quadrant of left female breast: Secondary | ICD-10-CM | POA: Diagnosis not present

## 2017-07-01 DIAGNOSIS — Z9012 Acquired absence of left breast and nipple: Secondary | ICD-10-CM | POA: Diagnosis not present

## 2017-07-01 LAB — COMPREHENSIVE METABOLIC PANEL
ALBUMIN: 3.8 g/dL (ref 3.5–5.0)
ALT: 16 U/L (ref 0–55)
ANION GAP: 7 (ref 3–11)
AST: 14 U/L (ref 5–34)
Alkaline Phosphatase: 82 U/L (ref 40–150)
BUN: 11 mg/dL (ref 7–26)
CHLORIDE: 109 mmol/L (ref 98–109)
CO2: 25 mmol/L (ref 22–29)
Calcium: 9 mg/dL (ref 8.4–10.4)
Creatinine, Ser: 0.67 mg/dL (ref 0.60–1.10)
GFR calc Af Amer: >60 mL/min (ref >60)
GLUCOSE: 94 mg/dL (ref 70–140)
POTASSIUM: 4.5 mmol/L (ref 3.5–5.1)
Sodium: 141 mmol/L (ref 136–145)
Total Bilirubin: 0.3 mg/dL (ref 0.2–1.2)
Total Protein: 6.7 g/dL (ref 6.4–8.3)

## 2017-07-01 LAB — CBC WITH DIFFERENTIAL/PLATELET
BASOS ABS: 0.1 10*3/uL (ref 0.0–0.1)
Basophils Relative: 1 %
Eosinophils Absolute: 0 10*3/uL (ref 0.0–0.5)
Eosinophils Relative: 1 %
HEMATOCRIT: 35.5 % (ref 34.8–46.6)
Hemoglobin: 11.7 g/dL (ref 11.6–15.9)
LYMPHS ABS: 1 10*3/uL (ref 0.9–3.3)
LYMPHS PCT: 25 %
MCH: 30.2 pg (ref 25.1–34.0)
MCHC: 32.9 g/dL (ref 31.5–36.0)
MCV: 91.7 fL (ref 79.5–101.0)
Monocytes Absolute: 0.4 10*3/uL (ref 0.1–0.9)
Monocytes Relative: 10 %
NEUTROS ABS: 2.7 10*3/uL (ref 1.5–6.5)
Neutrophils Relative %: 63 %
PLATELETS: 276 10*3/uL (ref 145–400)
RBC: 3.87 MIL/uL (ref 3.70–5.45)
RDW: 14.4 % (ref 11.2–14.5)
WBC: 4.2 10*3/uL (ref 3.9–10.3)

## 2017-07-01 MED ORDER — ACETAMINOPHEN 325 MG PO TABS
ORAL_TABLET | ORAL | Status: AC
  Filled 2017-07-01: qty 2

## 2017-07-01 MED ORDER — FAMOTIDINE IN NACL 20-0.9 MG/50ML-% IV SOLN
INTRAVENOUS | Status: AC
  Filled 2017-07-01: qty 50

## 2017-07-01 MED ORDER — SODIUM CHLORIDE 0.9 % IV SOLN
Freq: Once | INTRAVENOUS | Status: AC
  Administered 2017-07-01: 12:00:00 via INTRAVENOUS

## 2017-07-01 MED ORDER — DIPHENHYDRAMINE HCL 50 MG/ML IJ SOLN
25.0000 mg | Freq: Once | INTRAMUSCULAR | Status: AC
  Administered 2017-07-01: 25 mg via INTRAVENOUS

## 2017-07-01 MED ORDER — TRASTUZUMAB CHEMO 150 MG IV SOLR
2.0000 mg/kg | Freq: Once | INTRAVENOUS | Status: AC
  Administered 2017-07-01: 126 mg via INTRAVENOUS
  Filled 2017-07-01: qty 6

## 2017-07-01 MED ORDER — FAMOTIDINE IN NACL 20-0.9 MG/50ML-% IV SOLN
20.0000 mg | Freq: Once | INTRAVENOUS | Status: AC
  Administered 2017-07-01: 20 mg via INTRAVENOUS

## 2017-07-01 MED ORDER — SODIUM CHLORIDE 0.9 % IV SOLN
60.0000 mg/m2 | Freq: Once | INTRAVENOUS | Status: AC
  Administered 2017-07-01: 102 mg via INTRAVENOUS
  Filled 2017-07-01: qty 17

## 2017-07-01 MED ORDER — SODIUM CHLORIDE 0.9% FLUSH
10.0000 mL | INTRAVENOUS | Status: DC | PRN
  Administered 2017-07-01: 10 mL via INTRAVENOUS
  Filled 2017-07-01: qty 10

## 2017-07-01 MED ORDER — DIPHENHYDRAMINE HCL 50 MG/ML IJ SOLN
INTRAMUSCULAR | Status: AC
  Filled 2017-07-01: qty 1

## 2017-07-01 MED ORDER — SODIUM CHLORIDE 0.9% FLUSH
10.0000 mL | INTRAVENOUS | Status: DC | PRN
  Administered 2017-07-01: 10 mL
  Filled 2017-07-01: qty 10

## 2017-07-01 MED ORDER — ACETAMINOPHEN 325 MG PO TABS
650.0000 mg | ORAL_TABLET | Freq: Once | ORAL | Status: AC
  Administered 2017-07-01: 650 mg via ORAL

## 2017-07-01 MED ORDER — HEPARIN SOD (PORK) LOCK FLUSH 100 UNIT/ML IV SOLN
500.0000 [IU] | Freq: Once | INTRAVENOUS | Status: AC | PRN
  Administered 2017-07-01: 500 [IU]
  Filled 2017-07-01: qty 5

## 2017-07-01 NOTE — Patient Instructions (Signed)
[facility] Discharge Instructions for Patients Receiving Chemotherapy  Today you received the following chemotherapy agents:  trastuzumab (Herceptin) and paclitaxel (Taxol).  To help prevent nausea and vomiting after your treatment, we encourage you to take your nausea medication as directed.   If you develop nausea and vomiting that is not controlled by your nausea medication, call the clinic.   BELOW ARE SYMPTOMS THAT SHOULD BE REPORTED IMMEDIATELY:  *FEVER GREATER THAN 100.5 F  *CHILLS WITH OR WITHOUT FEVER  NAUSEA AND VOMITING THAT IS NOT CONTROLLED WITH YOUR NAUSEA MEDICATION  *UNUSUAL SHORTNESS OF BREATH  *UNUSUAL BRUISING OR BLEEDING  TENDERNESS IN MOUTH AND THROAT WITH OR WITHOUT PRESENCE OF ULCERS  *URINARY PROBLEMS  *BOWEL PROBLEMS  UNUSUAL RASH Items with * indicate a potential emergency and should be followed up as soon as possible.  Feel free to call the clinic should you have any questions or concerns. The clinic phone number is [phone].  Please show the [facility] at check-in to the Emergency Department and triage nurse.

## 2017-07-08 ENCOUNTER — Inpatient Hospital Stay: Payer: BLUE CROSS/BLUE SHIELD

## 2017-07-08 VITALS — BP 164/84 | HR 73 | Temp 98.4°F | Resp 17

## 2017-07-08 DIAGNOSIS — Z170 Estrogen receptor positive status [ER+]: Principal | ICD-10-CM

## 2017-07-08 DIAGNOSIS — C50312 Malignant neoplasm of lower-inner quadrant of left female breast: Secondary | ICD-10-CM

## 2017-07-08 DIAGNOSIS — Z95828 Presence of other vascular implants and grafts: Secondary | ICD-10-CM

## 2017-07-08 LAB — COMPREHENSIVE METABOLIC PANEL
ALBUMIN: 3.7 g/dL (ref 3.5–5.0)
ALK PHOS: 85 U/L (ref 40–150)
ALT: 17 U/L (ref 0–55)
ANION GAP: 7 (ref 3–11)
AST: 19 U/L (ref 5–34)
BUN: 13 mg/dL (ref 7–26)
CO2: 24 mmol/L (ref 22–29)
Calcium: 9.1 mg/dL (ref 8.4–10.4)
Chloride: 109 mmol/L (ref 98–109)
Creatinine, Ser: 0.71 mg/dL (ref 0.60–1.10)
GFR calc Af Amer: >60 mL/min (ref >60)
GFR calc non Af Amer: >60 mL/min (ref >60)
GLUCOSE: 87 mg/dL (ref 70–140)
POTASSIUM: 4.6 mmol/L (ref 3.5–5.1)
Sodium: 140 mmol/L (ref 136–145)
Total Bilirubin: 0.2 mg/dL (ref 0.2–1.2)
Total Protein: 6.7 g/dL (ref 6.4–8.3)

## 2017-07-08 LAB — CBC WITH DIFFERENTIAL/PLATELET
Basophils Absolute: 0.1 10*3/uL (ref 0.0–0.1)
Basophils Relative: 3 %
Eosinophils Absolute: 0.1 10*3/uL (ref 0.0–0.5)
Eosinophils Relative: 2 %
HEMATOCRIT: 33.6 % — AB (ref 34.8–46.6)
Hemoglobin: 11.5 g/dL — ABNORMAL LOW (ref 11.6–15.9)
LYMPHS PCT: 30 %
Lymphs Abs: 1.1 10*3/uL (ref 0.9–3.3)
MCH: 31.1 pg (ref 25.1–34.0)
MCHC: 34.1 g/dL (ref 31.5–36.0)
MCV: 91.3 fL (ref 79.5–101.0)
MONO ABS: 0.4 10*3/uL (ref 0.1–0.9)
MONOS PCT: 12 %
NEUTROS ABS: 2 10*3/uL (ref 1.5–6.5)
Neutrophils Relative %: 53 %
Platelets: 274 10*3/uL (ref 145–400)
RBC: 3.68 MIL/uL — ABNORMAL LOW (ref 3.70–5.45)
RDW: 14.6 % — AB (ref 11.2–14.5)
WBC: 3.7 10*3/uL — ABNORMAL LOW (ref 3.9–10.3)

## 2017-07-08 MED ORDER — HEPARIN SOD (PORK) LOCK FLUSH 100 UNIT/ML IV SOLN
500.0000 [IU] | Freq: Once | INTRAVENOUS | Status: AC | PRN
  Administered 2017-07-08: 500 [IU]
  Filled 2017-07-08: qty 5

## 2017-07-08 MED ORDER — ACETAMINOPHEN 325 MG PO TABS
650.0000 mg | ORAL_TABLET | Freq: Once | ORAL | Status: AC
  Administered 2017-07-08: 650 mg via ORAL

## 2017-07-08 MED ORDER — SODIUM CHLORIDE 0.9% FLUSH
10.0000 mL | INTRAVENOUS | Status: DC | PRN
  Administered 2017-07-08: 10 mL
  Filled 2017-07-08: qty 10

## 2017-07-08 MED ORDER — SODIUM CHLORIDE 0.9 % IV SOLN
60.0000 mg/m2 | Freq: Once | INTRAVENOUS | Status: AC
  Administered 2017-07-08: 102 mg via INTRAVENOUS
  Filled 2017-07-08: qty 17

## 2017-07-08 MED ORDER — DIPHENHYDRAMINE HCL 50 MG/ML IJ SOLN
25.0000 mg | Freq: Once | INTRAMUSCULAR | Status: AC
  Administered 2017-07-08: 25 mg via INTRAVENOUS

## 2017-07-08 MED ORDER — FAMOTIDINE IN NACL 20-0.9 MG/50ML-% IV SOLN
20.0000 mg | Freq: Once | INTRAVENOUS | Status: AC
  Administered 2017-07-08: 20 mg via INTRAVENOUS
  Filled 2017-07-08: qty 50

## 2017-07-08 MED ORDER — SODIUM CHLORIDE 0.9% FLUSH
10.0000 mL | INTRAVENOUS | Status: DC | PRN
  Administered 2017-07-08: 10 mL via INTRAVENOUS
  Filled 2017-07-08: qty 10

## 2017-07-08 MED ORDER — DIPHENHYDRAMINE HCL 50 MG/ML IJ SOLN
INTRAMUSCULAR | Status: AC
  Filled 2017-07-08: qty 1

## 2017-07-08 MED ORDER — DIPHENHYDRAMINE HCL 25 MG PO CAPS
ORAL_CAPSULE | ORAL | Status: AC
Start: 2017-07-08 — End: 2017-07-08
  Filled 2017-07-08: qty 2

## 2017-07-08 MED ORDER — SODIUM CHLORIDE 0.9 % IV SOLN
Freq: Once | INTRAVENOUS | Status: AC
  Administered 2017-07-08: 11:00:00 via INTRAVENOUS

## 2017-07-08 MED ORDER — TRASTUZUMAB CHEMO 150 MG IV SOLR
2.0000 mg/kg | Freq: Once | INTRAVENOUS | Status: AC
  Administered 2017-07-08: 126 mg via INTRAVENOUS
  Filled 2017-07-08: qty 6

## 2017-07-08 MED ORDER — ACETAMINOPHEN 325 MG PO TABS
ORAL_TABLET | ORAL | Status: AC
  Filled 2017-07-08: qty 2

## 2017-07-08 NOTE — Patient Instructions (Signed)
[facility] Discharge Instructions for Patients Receiving Chemotherapy  Today you received the following chemotherapy agents :  Herceptin,  Taxol.  To help prevent nausea and vomiting after your treatment, we encourage you to take your nausea medication as prescribed.   If you develop nausea and vomiting that is not controlled by your nausea medication, call the clinic.   BELOW ARE SYMPTOMS THAT SHOULD BE REPORTED IMMEDIATELY:  *FEVER GREATER THAN 100.5 F  *CHILLS WITH OR WITHOUT FEVER  NAUSEA AND VOMITING THAT IS NOT CONTROLLED WITH YOUR NAUSEA MEDICATION  *UNUSUAL SHORTNESS OF BREATH  *UNUSUAL BRUISING OR BLEEDING  TENDERNESS IN MOUTH AND THROAT WITH OR WITHOUT PRESENCE OF ULCERS  *URINARY PROBLEMS  *BOWEL PROBLEMS  UNUSUAL RASH Items with * indicate a potential emergency and should be followed up as soon as possible.  Feel free to call the clinic should you have any questions or concerns. The clinic phone number is [phone].  Please show the CHEMO ALERT CARD at check-in to the Emergency Department and triage nurse.   

## 2017-07-08 NOTE — Progress Notes (Signed)
Pt was not aware of the new ice treatment that we are promoting with 1hr taxol infusions.  Benefits shown in the study were reviewed with pt as well as process for ice treatment.  At this time, pt declines.  Pt encouraged to consider for her next treatment.

## 2017-07-11 ENCOUNTER — Telehealth: Payer: Self-pay | Admitting: *Deleted

## 2017-07-11 NOTE — Telephone Encounter (Signed)
Patient called and expressed desire to speak directly to Dr. Lindi Adie.  Questioned what it was in regards to.  She stated that she felt like she hit a wall this past week following treatment with flushing, eye twitching and elevated blood pressures 180s/102.  She stated she knew she only had a couple more treatments but was concerned about proceeding but didn't want to give up on treatment.  Just needed to discuss options.  Presented to Dr Lindi Adie and he advised he would call the patient directly.

## 2017-07-14 ENCOUNTER — Telehealth: Payer: Self-pay

## 2017-07-14 ENCOUNTER — Other Ambulatory Visit: Payer: Self-pay | Admitting: Hematology and Oncology

## 2017-07-14 MED ORDER — METOPROLOL TARTRATE 25 MG PO TABS: 25.0000 mg | ORAL_TABLET | Freq: Every day | ORAL | 0 refills | Status: DC

## 2017-07-14 NOTE — Assessment & Plan Note (Signed)
04/04/2017:Left mastectomy: IDC grade 3, 3.8 cm, intermediate grade DCIS, invasive cancer comes 0.1 cm to posterior margin and less than 0.1 cm to anterior inferior margin at 0/13 lymph nodes, ER 100%, PR 0%, HER-2 positive ratio 2.08 T2 N0 stage IIb  Treatment plan: 1.Adjuvant chemotherapywithTH followed by Herceptin maintenance for 1 year. 2. Adjuvant radiation therapyif necessary based upon final pathology 3. Adjuvant antiestrogen therapy --------------------------------------------------------- Current treatment: Cycle 9 day 1 Taxol Herceptin weekly  Patient plans to use marijuana daily to decrease adverse effects of chemo ECHO 03/28/2017: EF 60-65% Anti emetics were reviewed Patient taking probiotics, declines Neupogen if needed in the future.  HTN: Started metoprolol 25 mg daily  Adriana Jones will proceed with treatment. Her labs are stable.  She will return weekly for taxol, herceptin.

## 2017-07-14 NOTE — Telephone Encounter (Signed)
Pt called and is having concerns with blood pressure being elevated after chemotherapy treatments. She is wanting to know what her plan is for treatment this week and if she will be treated. Informed pt that Dr. Lindi Adie was currently seeing patients in clinic and that I would leave him a message and get back to her. No further questions at this time.  Cyndia Bent RN

## 2017-07-15 ENCOUNTER — Inpatient Hospital Stay: Payer: BLUE CROSS/BLUE SHIELD

## 2017-07-15 ENCOUNTER — Inpatient Hospital Stay (HOSPITAL_BASED_OUTPATIENT_CLINIC_OR_DEPARTMENT_OTHER): Payer: BLUE CROSS/BLUE SHIELD | Admitting: Hematology and Oncology

## 2017-07-15 VITALS — BP 153/86 | HR 63

## 2017-07-15 DIAGNOSIS — Z170 Estrogen receptor positive status [ER+]: Principal | ICD-10-CM

## 2017-07-15 DIAGNOSIS — I10 Essential (primary) hypertension: Secondary | ICD-10-CM

## 2017-07-15 DIAGNOSIS — C50312 Malignant neoplasm of lower-inner quadrant of left female breast: Secondary | ICD-10-CM

## 2017-07-15 DIAGNOSIS — Z95828 Presence of other vascular implants and grafts: Secondary | ICD-10-CM

## 2017-07-15 LAB — COMPREHENSIVE METABOLIC PANEL
ALBUMIN: 3.7 g/dL (ref 3.5–5.0)
ALK PHOS: 82 U/L (ref 40–150)
ALT: 17 U/L (ref 0–55)
AST: 15 U/L (ref 5–34)
Anion gap: 7 (ref 3–11)
BILIRUBIN TOTAL: 0.4 mg/dL (ref 0.2–1.2)
BUN: 10 mg/dL (ref 7–26)
CALCIUM: 9.1 mg/dL (ref 8.4–10.4)
CO2: 25 mmol/L (ref 22–29)
CREATININE: 0.66 mg/dL (ref 0.60–1.10)
Chloride: 108 mmol/L (ref 98–109)
GFR calc Af Amer: >60 mL/min (ref >60)
GLUCOSE: 101 mg/dL (ref 70–140)
Potassium: 4.3 mmol/L (ref 3.5–5.1)
Sodium: 140 mmol/L (ref 136–145)
TOTAL PROTEIN: 6.7 g/dL (ref 6.4–8.3)

## 2017-07-15 LAB — CBC WITH DIFFERENTIAL/PLATELET
BASOS ABS: 0.1 10*3/uL (ref 0.0–0.1)
BASOS PCT: 1 %
EOS ABS: 0.1 10*3/uL (ref 0.0–0.5)
Eosinophils Relative: 2 %
HCT: 34.9 % (ref 34.8–46.6)
Hemoglobin: 11.6 g/dL (ref 11.6–15.9)
Lymphocytes Relative: 23 %
Lymphs Abs: 0.9 10*3/uL (ref 0.9–3.3)
MCH: 30.5 pg (ref 25.1–34.0)
MCHC: 33.3 g/dL (ref 31.5–36.0)
MCV: 91.5 fL (ref 79.5–101.0)
MONO ABS: 0.4 10*3/uL (ref 0.1–0.9)
Monocytes Relative: 10 %
Neutro Abs: 2.6 10*3/uL (ref 1.5–6.5)
Neutrophils Relative %: 64 %
Platelets: 286 10*3/uL (ref 145–400)
RBC: 3.81 MIL/uL (ref 3.70–5.45)
RDW: 14.5 % (ref 11.2–14.5)
WBC: 4.1 10*3/uL (ref 3.9–10.3)

## 2017-07-15 MED ORDER — SODIUM CHLORIDE 0.9% FLUSH
10.0000 mL | INTRAVENOUS | Status: DC | PRN
  Administered 2017-07-15: 10 mL via INTRAVENOUS
  Filled 2017-07-15: qty 10

## 2017-07-15 MED ORDER — ACETAMINOPHEN 325 MG PO TABS
650.0000 mg | ORAL_TABLET | Freq: Once | ORAL | Status: AC
  Administered 2017-07-15: 650 mg via ORAL

## 2017-07-15 MED ORDER — FAMOTIDINE IN NACL 20-0.9 MG/50ML-% IV SOLN: 20.0000 mg | Freq: Once | INTRAVENOUS | Status: DC

## 2017-07-15 MED ORDER — COLD PACK MISC ONCOLOGY
1.0000 | Freq: Once | Status: DC | PRN
  Filled 2017-07-15: qty 1

## 2017-07-15 MED ORDER — DIPHENHYDRAMINE HCL 50 MG/ML IJ SOLN
25.0000 mg | Freq: Once | INTRAMUSCULAR | Status: AC
  Administered 2017-07-15: 25 mg via INTRAVENOUS

## 2017-07-15 MED ORDER — FAMOTIDINE 200 MG/20ML IV SOLN
20.0000 mg | Freq: Once | INTRAVENOUS | Status: AC
  Administered 2017-07-15: 20 mg via INTRAVENOUS
  Filled 2017-07-15: qty 2

## 2017-07-15 MED ORDER — PACLITAXEL CHEMO INJECTION 300 MG/50ML
60.0000 mg/m2 | Freq: Once | INTRAVENOUS | Status: AC
  Administered 2017-07-15: 102 mg via INTRAVENOUS
  Filled 2017-07-15: qty 17

## 2017-07-15 MED ORDER — DIPHENHYDRAMINE HCL 50 MG/ML IJ SOLN
INTRAMUSCULAR | Status: AC
  Filled 2017-07-15: qty 1

## 2017-07-15 MED ORDER — SODIUM CHLORIDE 0.9% FLUSH
10.0000 mL | INTRAVENOUS | Status: DC | PRN
  Administered 2017-07-15: 10 mL
  Filled 2017-07-15: qty 10

## 2017-07-15 MED ORDER — SODIUM CHLORIDE 0.9 % IV SOLN
Freq: Once | INTRAVENOUS | Status: AC
  Administered 2017-07-15: 12:00:00 via INTRAVENOUS

## 2017-07-15 MED ORDER — ACETAMINOPHEN 325 MG PO TABS
ORAL_TABLET | ORAL | Status: AC
  Filled 2017-07-15: qty 2

## 2017-07-15 MED ORDER — HEPARIN SOD (PORK) LOCK FLUSH 100 UNIT/ML IV SOLN
500.0000 [IU] | Freq: Once | INTRAVENOUS | Status: AC | PRN
  Administered 2017-07-15: 500 [IU]
  Filled 2017-07-15: qty 5

## 2017-07-15 MED ORDER — TRASTUZUMAB CHEMO 150 MG IV SOLR
2.0000 mg/kg | Freq: Once | INTRAVENOUS | Status: AC
  Administered 2017-07-15: 126 mg via INTRAVENOUS
  Filled 2017-07-15: qty 6

## 2017-07-15 NOTE — Patient Instructions (Signed)
Implanted Port Home Guide An implanted port is a type of central line that is placed under the skin. Central lines are used to provide IV access when treatment or nutrition needs to be given through a person's veins. Implanted ports are used for long-term IV access. An implanted port may be placed because:  You need IV medicine that would be irritating to the small veins in your hands or arms.  You need long-term IV medicines, such as antibiotics.  You need IV nutrition for a long period.  You need frequent blood draws for lab tests.  You need dialysis.  Implanted ports are usually placed in the chest area, but they can also be placed in the upper arm, the abdomen, or the leg. An implanted port has two main parts:  Reservoir. The reservoir is round and will appear as a small, raised area under your skin. The reservoir is the part where a needle is inserted to give medicines or draw blood.  Catheter. The catheter is a thin, flexible tube that extends from the reservoir. The catheter is placed into a large vein. Medicine that is inserted into the reservoir goes into the catheter and then into the vein.  How will I care for my incision site? Do not get the incision site wet. Bathe or shower as directed by your health care provider. How is my port accessed? Special steps must be taken to access the port:  Before the port is accessed, a numbing cream can be placed on the skin. This helps numb the skin over the port site.  Your health care provider uses a sterile technique to access the port. ? Your health care provider must put on a mask and sterile gloves. ? The skin over your port is cleaned carefully with an antiseptic and allowed to dry. ? The port is gently pinched between sterile gloves, and a needle is inserted into the port.  Only "non-coring" port needles should be used to access the port. Once the port is accessed, a blood return should be checked. This helps ensure that the port  is in the vein and is not clogged.  If your port needs to remain accessed for a constant infusion, a clear (transparent) bandage will be placed over the needle site. The bandage and needle will need to be changed every week, or as directed by your health care provider.  Keep the bandage covering the needle clean and dry. Do not get it wet. Follow your health care provider's instructions on how to take a shower or bath while the port is accessed.  If your port does not need to stay accessed, no bandage is needed over the port.  What is flushing? Flushing helps keep the port from getting clogged. Follow your health care provider's instructions on how and when to flush the port. Ports are usually flushed with saline solution or a medicine called heparin. The need for flushing will depend on how the port is used.  If the port is used for intermittent medicines or blood draws, the port will need to be flushed: ? After medicines have been given. ? After blood has been drawn. ? As part of routine maintenance.  If a constant infusion is running, the port may not need to be flushed.  How long will my port stay implanted? The port can stay in for as long as your health care provider thinks it is needed. When it is time for the port to come out, surgery will be   done to remove it. The procedure is similar to the one performed when the port was put in. When should I seek immediate medical care? When you have an implanted port, you should seek immediate medical care if:  You notice a bad smell coming from the incision site.  You have swelling, redness, or drainage at the incision site.  You have more swelling or pain at the port site or the surrounding area.  You have a fever that is not controlled with medicine.  This information is not intended to replace advice given to you by your health care provider. Make sure you discuss any questions you have with your health care provider. Document  Released: 05/10/2005 Document Revised: 10/16/2015 Document Reviewed: 01/15/2013 [employer] Interactive Patient Education  2017 [employer]  

## 2017-07-15 NOTE — Progress Notes (Signed)
Patient Care Team: Molli Posey, MD as PCP - General (Obstetrics and Gynecology)  DIAGNOSIS:  Encounter Diagnosis  Name Primary?  . Malignant neoplasm of lower-inner quadrant of left breast in female, estrogen receptor positive ([place])     SUMMARY OF ONCOLOGIC HISTORY:   Malignant neoplasm of lower-inner quadrant of left breast in female, estrogen receptor positive ([place])   02/28/2017 Initial Diagnosis    Palpable left breast masses 2.5 cm at 7:00 and another 2.5 cm at 6:00 1.9 cm apart biopsy: IDC grade 2 with DCISER 100%, PR 0%, Ki-67 20%, HER-2 positiveratio 2.08, axilla negative; in addition indeterminate calcifications medially and laterally,T2 N0 stage 2A clinical stage AJCC 8      04/04/2017 Surgery    Left mastectomy: IDC grade 3, 3.8 cm, intermediate grade DCIS, invasive cancer comes 0.1 cm to posterior margin and less than 0.1 cm to anterior inferior margin at 0/13 lymph nodes, ER 100%, PR 0%, HER-2 positive ratio 2.08 T2 N0 stage IIb      05/20/2017 -  Chemotherapy    Taxol Herceptin weekly x12 followed by Herceptin maintenance every 3 weeks for 1 year (patient refused [place])        CHIEF COMPLIANT: Cycle 9 Taxol Herceptin  INTERVAL HISTORY: Malik Ruffino is a 62 year old with above-mentioned history left breast cancer treated with mastectomy and is currently on adjuvant chemotherapy and today's cycle 9 of Taxol Herceptin.  She called me last week complaining of a flushing sensation as well as hypertension.  I started on metoprolol yesterday 25 mg.  It appears that she is tolerating it well but however her blood pressure still remains elevated.  She is very particular about medications and was not very keen on taking any antihypertensives to begin with.   REVIEW OF SYSTEMS:   Constitutional: Denies fevers, chills or abnormal weight loss Eyes: Denies blurriness of vision Ears, nose, mouth, throat, and face: Denies mucositis or sore throat Respiratory: Denies cough, dyspnea  or wheezes Cardiovascular: Denies palpitation, chest discomfort Gastrointestinal:  Denies nausea, heartburn or change in bowel habits Skin: Denies abnormal skin rashes Lymphatics: Denies new lymphadenopathy or easy bruising Neurological:Denies numbness, tingling or new weaknesses Behavioral/Psych: Mood is stable, no new changes  Extremities: No lower extremity edema  All other systems were reviewed with the patient and are negative.  I have reviewed the past medical history, past surgical history, social history and family history with the patient and they are unchanged from previous note.  ALLERGIES:  is allergic to amoxicillin and sulfa antibiotics.  MEDICATIONS:  Current Outpatient Medications  Medication Sig Dispense Refill  . Ascorbic Acid (VITAMIN C) 1000 MG tablet Take 1,000 mg by mouth daily.    . cholecalciferol 2000 units tablet Take 2 tablets (4,000 Units total) by mouth daily.    . metoprolol tartrate (LOPRESSOR) 25 MG tablet Take 1 tablet (25 mg total) by mouth daily. 30 tablet 0  . Probiotic Product (PROBIOTIC PO) Take by mouth daily.    Marland Kitchen UNABLE TO FIND 100 mg 2 (two) times daily. EMPOWER     No current facility-administered medications for this visit.     PHYSICAL EXAMINATION: ECOG PERFORMANCE STATUS: 1 - Symptomatic but completely ambulatory  Vitals:   07/15/17 1020  BP: (!) 189/89  Pulse: 73  Resp: 17  Temp: 98.1 F (36.7 C)  SpO2: 99%   Filed Weights   07/15/17 1020  Weight: 142 lb 6.4 oz (64.6 kg)    GENERAL:alert, no distress and comfortable SKIN: skin color,  texture, turgor are normal, no rashes or significant lesions EYES: normal, Conjunctiva are pink and non-injected, sclera clear OROPHARYNX:no exudate, no erythema and lips, buccal mucosa, and tongue normal  NECK: supple, thyroid normal size, non-tender, without nodularity LYMPH:  no palpable lymphadenopathy in the cervical, axillary or inguinal LUNGS: clear to auscultation and percussion with  normal breathing effort HEART: regular rate & rhythm and no murmurs and no lower extremity edema ABDOMEN:abdomen soft, non-tender and normal bowel sounds MUSCULOSKELETAL:no cyanosis of digits and no clubbing  NEURO: alert & oriented x 3 with fluent speech, no focal motor/sensory deficits EXTREMITIES: No lower extremity edema   LABORATORY DATA:  I have reviewed the data as listed CMP Latest Ref Rng & Units 07/15/2017 07/08/2017 07/01/2017  Glucose 70 - 140 mg/dL 101 87 94  BUN 7 - 26 mg/dL '10 13 11  '$ Creatinine 0.60 - 1.10 mg/dL 0.66 0.71 0.67  Sodium 136 - 145 mmol/L 140 140 141  Potassium 3.5 - 5.1 mmol/L 4.3 4.6 4.5  Chloride 98 - 109 mmol/L 108 109 109  CO2 22 - 29 mmol/L '25 24 25  '$ Calcium 8.4 - 10.4 mg/dL 9.1 9.1 9.0  Total Protein 6.4 - 8.3 g/dL 6.7 6.7 6.7  Total Bilirubin 0.2 - 1.2 mg/dL 0.4 0.2 0.3  Alkaline Phos 40 - 150 U/L 82 85 82  AST 5 - 34 U/L '15 19 14  '$ ALT 0 - 55 U/L '17 17 16    '$ Lab Results  Component Value Date   WBC 4.1 07/15/2017   HGB 11.6 07/15/2017   HCT 34.9 07/15/2017   MCV 91.5 07/15/2017   PLT 286 07/15/2017   NEUTROABS 2.6 07/15/2017    ASSESSMENT & PLAN:  Malignant neoplasm of lower-inner quadrant of left breast in female, estrogen receptor positive ([place]) 04/04/2017:Left mastectomy: IDC grade 3, 3.8 cm, intermediate grade DCIS, invasive cancer comes 0.1 cm to posterior margin and less than 0.1 cm to anterior inferior margin at 0/13 lymph nodes, ER 100%, PR 0%, HER-2 positive ratio 2.08 T2 N0 stage IIb  Treatment plan: 1.Adjuvant chemotherapywithTH followed by Herceptin maintenance for 1 year. 2. Adjuvant radiation therapyif necessary based upon final pathology 3. Adjuvant antiestrogen therapy --------------------------------------------------------- Current treatment: Cycle 9 day 1 Taxol Herceptin weekly  Patient plans to use marijuana daily to decrease adverse effects of chemo ECHO 03/28/2017: EF 60-65% Anti emetics were reviewed Patient  taking probiotics.  HTN: Started metoprolol 25 mg daily.  She may need twice a day metoprolol.  She will titrate the dosage up and see how she does  Alyxis will proceed with treatment. Her labs are stable.  She will return weekly for taxol, herceptin.  I spent 25 minutes talking to the patient of which more than half was spent in counseling and coordination of care.  No orders of the defined types were placed in this encounter.  The patient has a good understanding of the overall plan. she agrees with it. she will call with any problems that may develop before the next visit here.   Harriette Ohara, MD 07/15/17

## 2017-07-15 NOTE — Patient Instructions (Signed)
[facility] Discharge Instructions for Patients Receiving Chemotherapy  Today you received the following chemotherapy agents Herceptin and taxol  To help prevent nausea and vomiting after your treatment, we encourage you to take your nausea medication as directed  If you develop nausea and vomiting that is not controlled by your nausea medication, call the clinic.  {CHL  BELOW ARE SYMPTOMS THAT SHOULD BE REPORTED IMMEDIATELY:  *FEVER GREATER THAN 100.5 F  *CHILLS WITH OR WITHOUT FEVER  NAUSEA AND VOMITING THAT IS NOT CONTROLLED WITH YOUR NAUSEA MEDICATION  *UNUSUAL SHORTNESS OF BREATH  *UNUSUAL BRUISING OR BLEEDING  TENDERNESS IN MOUTH AND THROAT WITH OR WITHOUT PRESENCE OF ULCERS  *URINARY PROBLEMS  *BOWEL PROBLEMS  UNUSUAL RASH Items with * indicate a potential emergency and should be followed up as soon as possible.  Feel free to call the clinic should you have any questions or concerns. The clinic phone number is [phone].  Please show the [facility] at check-in to the Emergency Department and triage nurse.

## 2017-07-22 ENCOUNTER — Inpatient Hospital Stay: Payer: BLUE CROSS/BLUE SHIELD

## 2017-07-22 ENCOUNTER — Inpatient Hospital Stay: Payer: BLUE CROSS/BLUE SHIELD | Attending: Hematology and Oncology

## 2017-07-22 VITALS — BP 144/90 | HR 59 | Temp 98.4°F | Resp 18

## 2017-07-22 DIAGNOSIS — Z170 Estrogen receptor positive status [ER+]: Principal | ICD-10-CM

## 2017-07-22 DIAGNOSIS — I10 Essential (primary) hypertension: Secondary | ICD-10-CM | POA: Insufficient documentation

## 2017-07-22 DIAGNOSIS — C50312 Malignant neoplasm of lower-inner quadrant of left female breast: Secondary | ICD-10-CM

## 2017-07-22 DIAGNOSIS — Z5111 Encounter for antineoplastic chemotherapy: Secondary | ICD-10-CM | POA: Insufficient documentation

## 2017-07-22 DIAGNOSIS — Z5112 Encounter for antineoplastic immunotherapy: Secondary | ICD-10-CM | POA: Diagnosis not present

## 2017-07-22 DIAGNOSIS — Z9012 Acquired absence of left breast and nipple: Secondary | ICD-10-CM | POA: Diagnosis not present

## 2017-07-22 LAB — CBC WITH DIFFERENTIAL/PLATELET
BASOS ABS: 0 10*3/uL (ref 0.0–0.1)
Basophils Relative: 1 %
Eosinophils Absolute: 0.1 10*3/uL (ref 0.0–0.5)
Eosinophils Relative: 2 %
HCT: 35.3 % (ref 34.8–46.6)
Hemoglobin: 11.5 g/dL — ABNORMAL LOW (ref 11.6–15.9)
LYMPHS PCT: 24 %
Lymphs Abs: 1.1 10*3/uL (ref 0.9–3.3)
MCH: 30.7 pg (ref 25.1–34.0)
MCHC: 32.6 g/dL (ref 31.5–36.0)
MCV: 94.4 fL (ref 79.5–101.0)
Monocytes Absolute: 0.6 10*3/uL (ref 0.1–0.9)
Monocytes Relative: 12 %
Neutro Abs: 2.8 10*3/uL (ref 1.5–6.5)
Neutrophils Relative %: 61 %
Platelets: 268 10*3/uL (ref 145–400)
RBC: 3.74 MIL/uL (ref 3.70–5.45)
RDW: 14.4 % (ref 11.2–14.5)
WBC: 4.6 10*3/uL (ref 3.9–10.3)

## 2017-07-22 LAB — COMPREHENSIVE METABOLIC PANEL
ALT: 16 U/L (ref 0–55)
AST: 17 U/L (ref 5–34)
Albumin: 3.7 g/dL (ref 3.5–5.0)
Alkaline Phosphatase: 87 U/L (ref 40–150)
Anion gap: 7 (ref 3–11)
BUN: 10 mg/dL (ref 7–26)
CHLORIDE: 108 mmol/L (ref 98–109)
CO2: 25 mmol/L (ref 22–29)
Calcium: 9.3 mg/dL (ref 8.4–10.4)
Creatinine, Ser: 0.67 mg/dL (ref 0.60–1.10)
Glucose, Bld: 88 mg/dL (ref 70–140)
POTASSIUM: 4.5 mmol/L (ref 3.5–5.1)
Sodium: 140 mmol/L (ref 136–145)
Total Bilirubin: 0.3 mg/dL (ref 0.2–1.2)
Total Protein: 6.7 g/dL (ref 6.4–8.3)

## 2017-07-22 MED ORDER — DIPHENHYDRAMINE HCL 50 MG/ML IJ SOLN
25.0000 mg | Freq: Once | INTRAMUSCULAR | Status: AC
  Administered 2017-07-22: 25 mg via INTRAVENOUS

## 2017-07-22 MED ORDER — SODIUM CHLORIDE 0.9 % IV SOLN
60.0000 mg/m2 | Freq: Once | INTRAVENOUS | Status: AC
  Administered 2017-07-22: 102 mg via INTRAVENOUS
  Filled 2017-07-22: qty 17

## 2017-07-22 MED ORDER — SODIUM CHLORIDE 0.9 % IV SOLN
Freq: Once | INTRAVENOUS | Status: AC
  Administered 2017-07-22: 10:00:00 via INTRAVENOUS

## 2017-07-22 MED ORDER — DIPHENHYDRAMINE HCL 50 MG/ML IJ SOLN
INTRAMUSCULAR | Status: AC
  Filled 2017-07-22: qty 1

## 2017-07-22 MED ORDER — ACETAMINOPHEN 325 MG PO TABS
ORAL_TABLET | ORAL | Status: AC
  Filled 2017-07-22: qty 2

## 2017-07-22 MED ORDER — TRASTUZUMAB CHEMO 150 MG IV SOLR
2.0000 mg/kg | Freq: Once | INTRAVENOUS | Status: AC
  Administered 2017-07-22: 126 mg via INTRAVENOUS
  Filled 2017-07-22: qty 6

## 2017-07-22 MED ORDER — SODIUM CHLORIDE 0.9% FLUSH
10.0000 mL | INTRAVENOUS | Status: DC | PRN
  Administered 2017-07-22: 10 mL
  Filled 2017-07-22: qty 10

## 2017-07-22 MED ORDER — ACETAMINOPHEN 325 MG PO TABS
650.0000 mg | ORAL_TABLET | Freq: Once | ORAL | Status: AC
  Administered 2017-07-22: 650 mg via ORAL

## 2017-07-22 MED ORDER — SODIUM CHLORIDE 0.9 % IV SOLN
20.0000 mg | Freq: Once | INTRAVENOUS | Status: AC
  Administered 2017-07-22: 20 mg via INTRAVENOUS
  Filled 2017-07-22: qty 2

## 2017-07-22 MED ORDER — FAMOTIDINE IN NACL 20-0.9 MG/50ML-% IV SOLN: 20.0000 mg | Freq: Once | INTRAVENOUS | Status: DC

## 2017-07-22 MED ORDER — HEPARIN SOD (PORK) LOCK FLUSH 100 UNIT/ML IV SOLN
500.0000 [IU] | Freq: Once | INTRAVENOUS | Status: AC | PRN
  Administered 2017-07-22: 500 [IU]
  Filled 2017-07-22: qty 5

## 2017-07-22 NOTE — Patient Instructions (Signed)
[facility] Discharge Instructions for Patients Receiving Chemotherapy  Today you received the following chemotherapy agents Herceptin and taxol  To help prevent nausea and vomiting after your treatment, we encourage you to take your nausea medication as directed  If you develop nausea and vomiting that is not controlled by your nausea medication, call the clinic.  {CHL  BELOW ARE SYMPTOMS THAT SHOULD BE REPORTED IMMEDIATELY:  *FEVER GREATER THAN 100.5 F  *CHILLS WITH OR WITHOUT FEVER  NAUSEA AND VOMITING THAT IS NOT CONTROLLED WITH YOUR NAUSEA MEDICATION  *UNUSUAL SHORTNESS OF BREATH  *UNUSUAL BRUISING OR BLEEDING  TENDERNESS IN MOUTH AND THROAT WITH OR WITHOUT PRESENCE OF ULCERS  *URINARY PROBLEMS  *BOWEL PROBLEMS  UNUSUAL RASH Items with * indicate a potential emergency and should be followed up as soon as possible.  Feel free to call the clinic should you have any questions or concerns. The clinic phone number is [phone].  Please show the [facility] at check-in to the Emergency Department and triage nurse.

## 2017-07-29 ENCOUNTER — Inpatient Hospital Stay: Payer: BLUE CROSS/BLUE SHIELD

## 2017-07-29 VITALS — BP 151/74 | HR 56 | Temp 98.7°F | Resp 16

## 2017-07-29 DIAGNOSIS — C50312 Malignant neoplasm of lower-inner quadrant of left female breast: Secondary | ICD-10-CM

## 2017-07-29 DIAGNOSIS — Z95828 Presence of other vascular implants and grafts: Secondary | ICD-10-CM

## 2017-07-29 DIAGNOSIS — Z170 Estrogen receptor positive status [ER+]: Principal | ICD-10-CM

## 2017-07-29 LAB — COMPREHENSIVE METABOLIC PANEL
ALBUMIN: 3.6 g/dL (ref 3.5–5.0)
ALT: 17 U/L (ref 0–55)
AST: 19 U/L (ref 5–34)
Alkaline Phosphatase: 86 U/L (ref 40–150)
Anion gap: 7 (ref 3–11)
BUN: 14 mg/dL (ref 7–26)
CHLORIDE: 109 mmol/L (ref 98–109)
CO2: 25 mmol/L (ref 22–29)
CREATININE: 0.68 mg/dL (ref 0.60–1.10)
Calcium: 9 mg/dL (ref 8.4–10.4)
GFR calc non Af Amer: >60 mL/min (ref >60)
GLUCOSE: 104 mg/dL (ref 70–140)
Potassium: 4.3 mmol/L (ref 3.5–5.1)
SODIUM: 141 mmol/L (ref 136–145)
Total Bilirubin: 0.4 mg/dL (ref 0.2–1.2)
Total Protein: 6.7 g/dL (ref 6.4–8.3)

## 2017-07-29 LAB — CBC WITH DIFFERENTIAL/PLATELET
Basophils Absolute: 0.1 10*3/uL (ref 0.0–0.1)
Basophils Relative: 1 %
EOS ABS: 0.1 10*3/uL (ref 0.0–0.5)
Eosinophils Relative: 2 %
HCT: 34.4 % — ABNORMAL LOW (ref 34.8–46.6)
HEMOGLOBIN: 11.3 g/dL — AB (ref 11.6–15.9)
LYMPHS ABS: 0.8 10*3/uL — AB (ref 0.9–3.3)
Lymphocytes Relative: 23 %
MCH: 30.3 pg (ref 25.1–34.0)
MCHC: 32.8 g/dL (ref 31.5–36.0)
MCV: 92.4 fL (ref 79.5–101.0)
MONO ABS: 0.4 10*3/uL (ref 0.1–0.9)
MONOS PCT: 11 %
NEUTROS PCT: 63 %
Neutro Abs: 2.3 10*3/uL (ref 1.5–6.5)
Platelets: 287 10*3/uL (ref 145–400)
RBC: 3.72 MIL/uL (ref 3.70–5.45)
RDW: 14.9 % — ABNORMAL HIGH (ref 11.2–14.5)
WBC: 3.7 10*3/uL — ABNORMAL LOW (ref 3.9–10.3)

## 2017-07-29 MED ORDER — PACLITAXEL CHEMO INJECTION 300 MG/50ML: 60.0000 mg/m2 | Freq: Once | INTRAVENOUS | Status: DC

## 2017-07-29 MED ORDER — ACETAMINOPHEN 325 MG PO TABS
650.0000 mg | ORAL_TABLET | Freq: Once | ORAL | Status: AC
  Administered 2017-07-29: 650 mg via ORAL

## 2017-07-29 MED ORDER — FAMOTIDINE IN NACL 20-0.9 MG/50ML-% IV SOLN: 20.0000 mg | Freq: Once | INTRAVENOUS | Status: DC

## 2017-07-29 MED ORDER — HEPARIN SOD (PORK) LOCK FLUSH 100 UNIT/ML IV SOLN
500.0000 [IU] | Freq: Once | INTRAVENOUS | Status: AC | PRN
  Administered 2017-07-29: 500 [IU]
  Filled 2017-07-29: qty 5

## 2017-07-29 MED ORDER — SODIUM CHLORIDE 0.9 % IV SOLN
60.0000 mg/m2 | Freq: Once | INTRAVENOUS | Status: AC
  Administered 2017-07-29: 102 mg via INTRAVENOUS
  Filled 2017-07-29: qty 17

## 2017-07-29 MED ORDER — SODIUM CHLORIDE 0.9% FLUSH
10.0000 mL | INTRAVENOUS | Status: DC | PRN
  Administered 2017-07-29: 10 mL
  Filled 2017-07-29: qty 10

## 2017-07-29 MED ORDER — SODIUM CHLORIDE 0.9 % IV SOLN
20.0000 mg | Freq: Once | INTRAVENOUS | Status: AC
  Administered 2017-07-29: 20 mg via INTRAVENOUS
  Filled 2017-07-29: qty 2

## 2017-07-29 MED ORDER — DIPHENHYDRAMINE HCL 50 MG/ML IJ SOLN
INTRAMUSCULAR | Status: AC
  Filled 2017-07-29: qty 1

## 2017-07-29 MED ORDER — SODIUM CHLORIDE 0.9% FLUSH
10.0000 mL | INTRAVENOUS | Status: DC | PRN
  Administered 2017-07-29: 10 mL via INTRAVENOUS
  Filled 2017-07-29: qty 10

## 2017-07-29 MED ORDER — SODIUM CHLORIDE 0.9 % IV SOLN
Freq: Once | INTRAVENOUS | Status: AC
  Administered 2017-07-29: 10:00:00 via INTRAVENOUS

## 2017-07-29 MED ORDER — ACETAMINOPHEN 325 MG PO TABS
ORAL_TABLET | ORAL | Status: AC
  Filled 2017-07-29: qty 2

## 2017-07-29 MED ORDER — TRASTUZUMAB CHEMO 150 MG IV SOLR
2.0000 mg/kg | Freq: Once | INTRAVENOUS | Status: AC
  Administered 2017-07-29: 126 mg via INTRAVENOUS
  Filled 2017-07-29: qty 6

## 2017-07-29 MED ORDER — DIPHENHYDRAMINE HCL 50 MG/ML IJ SOLN
25.0000 mg | Freq: Once | INTRAMUSCULAR | Status: AC
  Administered 2017-07-29: 25 mg via INTRAVENOUS

## 2017-07-29 NOTE — Patient Instructions (Signed)
[facility] Discharge Instructions for Patients Receiving Chemotherapy  Today you received the following chemotherapy agents Herceptin and taxol  To help prevent nausea and vomiting after your treatment, we encourage you to take your nausea medication as directed  If you develop nausea and vomiting that is not controlled by your nausea medication, call the clinic.  {CHL  BELOW ARE SYMPTOMS THAT SHOULD BE REPORTED IMMEDIATELY:  *FEVER GREATER THAN 100.5 F  *CHILLS WITH OR WITHOUT FEVER  NAUSEA AND VOMITING THAT IS NOT CONTROLLED WITH YOUR NAUSEA MEDICATION  *UNUSUAL SHORTNESS OF BREATH  *UNUSUAL BRUISING OR BLEEDING  TENDERNESS IN MOUTH AND THROAT WITH OR WITHOUT PRESENCE OF ULCERS  *URINARY PROBLEMS  *BOWEL PROBLEMS  UNUSUAL RASH Items with * indicate a potential emergency and should be followed up as soon as possible.  Feel free to call the clinic should you have any questions or concerns. The clinic phone number is [phone].  Please show the [facility] at check-in to the Emergency Department and triage nurse.

## 2017-08-03 ENCOUNTER — Ambulatory Visit (HOSPITAL_COMMUNITY)
Admission: RE | Admit: 2017-08-03 | Discharge: 2017-08-03 | Disposition: A | Discharge status: Home / Self Care | Payer: BLUE CROSS/BLUE SHIELD | Source: Ambulatory Visit | Attending: Internal Medicine | Admitting: Internal Medicine

## 2017-08-03 ENCOUNTER — Encounter (HOSPITAL_COMMUNITY): Payer: Self-pay | Admitting: Internal Medicine

## 2017-08-03 ENCOUNTER — Ambulatory Visit (HOSPITAL_BASED_OUTPATIENT_CLINIC_OR_DEPARTMENT_OTHER)
Admission: RE | Admit: 2017-08-03 | Discharge: 2017-08-03 | Disposition: A | Discharge status: Home / Self Care | Payer: BLUE CROSS/BLUE SHIELD | Source: Ambulatory Visit | Attending: Internal Medicine | Admitting: Internal Medicine

## 2017-08-03 VITALS — BP 172/90 | HR 66 | Wt 142.4 lb

## 2017-08-03 DIAGNOSIS — C50312 Malignant neoplasm of lower-inner quadrant of left female breast: Secondary | ICD-10-CM

## 2017-08-03 DIAGNOSIS — Z170 Estrogen receptor positive status [ER+]: Secondary | ICD-10-CM

## 2017-08-03 DIAGNOSIS — I081 Rheumatic disorders of both mitral and tricuspid valves: Secondary | ICD-10-CM | POA: Insufficient documentation

## 2017-08-03 DIAGNOSIS — Z79899 Other long term (current) drug therapy: Secondary | ICD-10-CM | POA: Diagnosis not present

## 2017-08-03 DIAGNOSIS — F419 Anxiety disorder, unspecified: Secondary | ICD-10-CM | POA: Insufficient documentation

## 2017-08-03 DIAGNOSIS — I10 Essential (primary) hypertension: Secondary | ICD-10-CM | POA: Diagnosis not present

## 2017-08-03 DIAGNOSIS — Z87891 Personal history of nicotine dependence: Secondary | ICD-10-CM | POA: Diagnosis not present

## 2017-08-03 NOTE — Progress Notes (Signed)
  Echocardiogram 2D Echocardiogram has been performed.  Divit Stipp T Kedra Mcglade 08/03/2017, 11:31 AM

## 2017-08-03 NOTE — Patient Instructions (Signed)
Your physician recommends that you schedule a follow-up appointment in: 3 months with echocardiogram  

## 2017-08-03 NOTE — Progress Notes (Signed)
Cardio-Oncology Clinic Note   Referring Physician: Dr. Lindi Adie Primary Cardiologist: New (Dr. Haroldine Laws)  HPI: Adriana Jones is a 62 y.o. female with past medical history of Left Breast CA who is referred by Dr. Lindi Adie  to establish in the cardio-oncology clinic for monitoring of cardio-toxicty while undergoing chemotherapy.  Cancer Profile  Malignant neoplasm of lower-inner quadrant of left breast in female, estrogen receptor positive ([place]) 02/28/2017:Palpable left breast masses 2.5 cm at 7:00 and another 2.5 cm at 6:00 1.9 cm apart biopsy: IDC grade 2 with DCIS ER 100%, PR 0%, Ki-67 20%, HER-2 positiveratio 2.08, axilla negative; in addition indeterminate calcifications medially and laterally,T2 N0 stage 2A clinical stage AJCC 8   Treatment Plan  1. Mastectomy with sentinel lymph node biopsy 2. Adjuvant chemotherapy depending on the final tumor size this may be Taxol Herceptin versus TCH versus TCH [place]. 3. Adjuvant radiation therapy if necessary based upon final pathology 4. Adjuvant antiestrogen therapy  Returns for f/u. Doing ok. Denies CP or SOB. Now vaping. No edema orthopnea or PND. BPs have been high at [facility] but taking BPs at home and SBP mostly in 120s. Dr. Lindi Adie started metoprolol. She says she doesn't want to keep taking.   No family history of coronary disease.   Echo today EF 60-65% GLS -22.2 LS 9.1   Echo 11/18 - EF 60-65% GLS -22.9 Lateral S'10.6    Past Medical History:  Diagnosis Date  . Anxiety   . Cancer [facility])    breast  . Headache     Current Outpatient Medications  Medication Sig Dispense Refill  . Ascorbic Acid (VITAMIN C) 1000 MG tablet Take 1,000 mg by mouth daily.    . cholecalciferol 2000 units tablet Take 2 tablets (4,000 Units total) by mouth daily.    . metoprolol tartrate (LOPRESSOR) 25 MG tablet Take 1 tablet (25 mg total) by mouth daily. (Patient taking differently: Take 25 mg by mouth daily. Takes 0.5 tablet BID) 30 tablet 0  .  Probiotic Product (PROBIOTIC PO) Take by mouth daily.    Marland Kitchen UNABLE TO FIND 100 mg 2 (two) times daily. EMPOWER     No current facility-administered medications for this encounter.     Allergies  Allergen Reactions  . Amoxicillin Hives    Has patient had a PCN reaction causing immediate rash, facial/tongue/throat swelling, SOB or lightheadedness with hypotension: No Has patient had a PCN reaction causing severe rash involving mucus membranes or skin necrosis: No Has patient had a PCN reaction that required hospitalization: No Has patient had a PCN reaction occurring within the last 10 years: Yes If all of the above answers are "NO", then may proceed with Cephalosporin use.   . Sulfa Antibiotics Hives      Social History   Socioeconomic History  . Marital status: Married    Spouse name: Not on file  . Number of children: Not on file  . Years of education: Not on file  . Highest education level: Not on file  Social Needs  . Financial resource strain: Not on file  . Food insecurity - worry: Not on file  . Food insecurity - inability: Not on file  . Transportation needs - medical: Not on file  . Transportation needs - non-medical: Not on file  Occupational History  . Not on file  Tobacco Use  . Smoking status: Former Smoker    Years: 3.00    Types: E-cigarettes    Start date: 08/07/2013  . Smokeless tobacco:  Never Used  Substance and Sexual Activity  . Alcohol use: Yes    Comment: 4-10/wk  . Drug use: No  . Sexual activity: Not on file  Other Topics Concern  . Not on file  Social History Narrative  . Not on file     No family history on file.  No family history of coronary disease.   Vitals:   08/03/17 1201  BP: (!) 172/90  Pulse: 66  SpO2: 98%  Weight: 142 lb 6.4 oz (64.6 kg)     PHYSICAL EXAM: General:  Well appearing. No resp difficulty HEENT: normal Neck: supple. no JVD. Carotids 2+ bilat; no bruits. No lymphadenopathy or thryomegaly appreciated. Cor:  PMI nondisplaced. Regular rate & rhythm. No rubs, gallops or murmurs. Lungs: clear Abdomen: soft, nontender, nondistended. No hepatosplenomegaly. No bruits or masses. Good bowel sounds. Extremities: no cyanosis, clubbing, rash, edema Neuro: alert & orientedx3, cranial nerves grossly intact. moves all 4 extremities w/o difficulty. Affect pleasant   ASSESSMENT & PLAN:  1. Breast CA of Lower-Inner quadrant of left breast in female, Estrogen Receptor + I reviewed echos personally. EF and Doppler parameters stable. No HF on exam. Continue Herceptin.  2. HTN - BP remains elevated in the office and at the [facility]. However BPs at home seem well controlled. Me be a component of white-coat HTN. She will calibrated her home BP cuff against the [place] cuff. If accurate, she will follow BP log at home for several weeks and then we can make a decision regarding further need for anti-HTN therapy.     Glori Bickers, MD 08/03/17 12:16 PM

## 2017-08-03 NOTE — Addendum Note (Signed)
Encounter addended by: Scarlette Calico, RN on: 08/03/2017 12:32 PM  Actions taken: Sign clinical note, Order list changed, Diagnosis association updated

## 2017-08-05 ENCOUNTER — Inpatient Hospital Stay: Payer: BLUE CROSS/BLUE SHIELD

## 2017-08-05 ENCOUNTER — Inpatient Hospital Stay (HOSPITAL_BASED_OUTPATIENT_CLINIC_OR_DEPARTMENT_OTHER): Payer: BLUE CROSS/BLUE SHIELD | Admitting: Hematology and Oncology

## 2017-08-05 VITALS — BP 162/81 | HR 63 | Temp 97.9°F | Resp 18 | Ht 64.0 in | Wt 142.7 lb

## 2017-08-05 DIAGNOSIS — Z79811 Long term (current) use of aromatase inhibitors: Secondary | ICD-10-CM | POA: Diagnosis not present

## 2017-08-05 DIAGNOSIS — I10 Essential (primary) hypertension: Secondary | ICD-10-CM

## 2017-08-05 DIAGNOSIS — C50312 Malignant neoplasm of lower-inner quadrant of left female breast: Secondary | ICD-10-CM | POA: Diagnosis not present

## 2017-08-05 DIAGNOSIS — Z170 Estrogen receptor positive status [ER+]: Secondary | ICD-10-CM | POA: Diagnosis not present

## 2017-08-05 LAB — COMPREHENSIVE METABOLIC PANEL
ALK PHOS: 78 U/L (ref 40–150)
ALT: 19 U/L (ref 0–55)
AST: 18 U/L (ref 5–34)
Albumin: 3.6 g/dL (ref 3.5–5.0)
Anion gap: 7 (ref 3–11)
BUN: 18 mg/dL (ref 7–26)
CALCIUM: 9.1 mg/dL (ref 8.4–10.4)
CO2: 22 mmol/L (ref 22–29)
CREATININE: 0.65 mg/dL (ref 0.60–1.10)
Chloride: 111 mmol/L — ABNORMAL HIGH (ref 98–109)
Glucose, Bld: 81 mg/dL (ref 70–140)
Potassium: 4 mmol/L (ref 3.5–5.1)
Sodium: 140 mmol/L (ref 136–145)
Total Bilirubin: 0.4 mg/dL (ref 0.2–1.2)
Total Protein: 6.7 g/dL (ref 6.4–8.3)

## 2017-08-05 LAB — CBC WITH DIFFERENTIAL/PLATELET
Basophils Absolute: 0.1 10*3/uL (ref 0.0–0.1)
Basophils Relative: 1 %
EOS ABS: 0.1 10*3/uL (ref 0.0–0.5)
Eosinophils Relative: 2 %
HCT: 33.3 % — ABNORMAL LOW (ref 34.8–46.6)
HEMOGLOBIN: 11.3 g/dL — AB (ref 11.6–15.9)
Lymphocytes Relative: 21 %
Lymphs Abs: 0.9 10*3/uL (ref 0.9–3.3)
MCH: 31.2 pg (ref 25.1–34.0)
MCHC: 33.9 g/dL (ref 31.5–36.0)
MCV: 92.3 fL (ref 79.5–101.0)
Monocytes Absolute: 0.5 10*3/uL (ref 0.1–0.9)
Monocytes Relative: 12 %
NEUTROS PCT: 64 %
Neutro Abs: 2.8 10*3/uL (ref 1.5–6.5)
Platelets: 304 10*3/uL (ref 145–400)
RBC: 3.61 MIL/uL — AB (ref 3.70–5.45)
RDW: 15.1 % — ABNORMAL HIGH (ref 11.2–14.5)
WBC: 4.4 10*3/uL (ref 3.9–10.3)

## 2017-08-05 MED ORDER — ACETAMINOPHEN 325 MG PO TABS
ORAL_TABLET | ORAL | Status: AC
  Filled 2017-08-05: qty 2

## 2017-08-05 MED ORDER — FAMOTIDINE IN NACL 20-0.9 MG/50ML-% IV SOLN: 20.0000 mg | Freq: Once | INTRAVENOUS | Status: DC

## 2017-08-05 MED ORDER — SODIUM CHLORIDE 0.9% FLUSH
10.0000 mL | INTRAVENOUS | Status: DC | PRN
  Administered 2017-08-05: 10 mL
  Filled 2017-08-05: qty 10

## 2017-08-05 MED ORDER — DIPHENHYDRAMINE HCL 50 MG/ML IJ SOLN
25.0000 mg | Freq: Once | INTRAMUSCULAR | Status: AC
  Administered 2017-08-05: 25 mg via INTRAVENOUS

## 2017-08-05 MED ORDER — PACLITAXEL CHEMO INJECTION 300 MG/50ML
60.0000 mg/m2 | Freq: Once | INTRAVENOUS | Status: AC
Start: 2017-08-05 — End: 2017-08-05
  Administered 2017-08-05: 102 mg via INTRAVENOUS
  Filled 2017-08-05: qty 17

## 2017-08-05 MED ORDER — SODIUM CHLORIDE 0.9 % IV SOLN
6.0000 mg/kg | Freq: Once | INTRAVENOUS | Status: AC
Start: 2017-08-05 — End: 2017-08-05
  Administered 2017-08-05: 378 mg via INTRAVENOUS
  Filled 2017-08-05: qty 18

## 2017-08-05 MED ORDER — SODIUM CHLORIDE 0.9 % IV SOLN
Freq: Once | INTRAVENOUS | Status: AC
  Administered 2017-08-05: 11:00:00 via INTRAVENOUS

## 2017-08-05 MED ORDER — HEPARIN SOD (PORK) LOCK FLUSH 100 UNIT/ML IV SOLN
500.0000 [IU] | Freq: Once | INTRAVENOUS | Status: AC | PRN
  Administered 2017-08-05: 500 [IU]
  Filled 2017-08-05: qty 5

## 2017-08-05 MED ORDER — SODIUM CHLORIDE 0.9 % IV SOLN
20.0000 mg | Freq: Once | INTRAVENOUS | Status: AC
  Administered 2017-08-05: 20 mg via INTRAVENOUS
  Filled 2017-08-05: qty 2

## 2017-08-05 MED ORDER — DIPHENHYDRAMINE HCL 50 MG/ML IJ SOLN
INTRAMUSCULAR | Status: AC
  Filled 2017-08-05: qty 1

## 2017-08-05 MED ORDER — ACETAMINOPHEN 325 MG PO TABS
650.0000 mg | ORAL_TABLET | Freq: Once | ORAL | Status: AC
  Administered 2017-08-05: 650 mg via ORAL

## 2017-08-05 NOTE — Patient Instructions (Signed)
[place] Discharge Instructions for Patients Receiving Chemotherapy  Today you received the following chemotherapy agents: Herceptin & Taxol  To help prevent nausea and vomiting after your treatment, we encourage you to take your nausea medication as directed  If you develop nausea and vomiting that is not controlled by your nausea medication, call the clinic.  {CHL  BELOW ARE SYMPTOMS THAT SHOULD BE REPORTED IMMEDIATELY:  *FEVER GREATER THAN 100.5 F  *CHILLS WITH OR WITHOUT FEVER  NAUSEA AND VOMITING THAT IS NOT CONTROLLED WITH YOUR NAUSEA MEDICATION  *UNUSUAL SHORTNESS OF BREATH  *UNUSUAL BRUISING OR BLEEDING  TENDERNESS IN MOUTH AND THROAT WITH OR WITHOUT PRESENCE OF ULCERS  *URINARY PROBLEMS  *BOWEL PROBLEMS  UNUSUAL RASH Items with * indicate a potential emergency and should be followed up as soon as possible.  Feel free to call the clinic should you have any questions or concerns. The clinic phone number is [phone].  Please show the [place] at check-in to the Emergency Department and triage nurse.

## 2017-08-05 NOTE — Progress Notes (Signed)
Patient Care Team: Molli Posey, MD as PCP - General (Obstetrics and Gynecology)  DIAGNOSIS:  Encounter Diagnosis  Name Primary?  . Malignant neoplasm of lower-inner quadrant of left breast in female, estrogen receptor positive ([place])     SUMMARY OF ONCOLOGIC HISTORY:   Malignant neoplasm of lower-inner quadrant of left breast in female, estrogen receptor positive ([place])   02/28/2017 Initial Diagnosis    Palpable left breast masses 2.5 cm at 7:00 and another 2.5 cm at 6:00 1.9 cm apart biopsy: IDC grade 2 with DCISER 100%, PR 0%, Ki-67 20%, HER-2 positiveratio 2.08, axilla negative; in addition indeterminate calcifications medially and laterally,T2 N0 stage 2A clinical stage AJCC 8      04/04/2017 Surgery    Left mastectomy: IDC grade 3, 3.8 cm, intermediate grade DCIS, invasive cancer comes 0.1 cm to posterior margin and less than 0.1 cm to anterior inferior margin at 0/13 lymph nodes, ER 100%, PR 0%, HER-2 positive ratio 2.08 T2 N0 stage IIb      05/20/2017 -  Chemotherapy    Taxol Herceptin weekly x12 followed by Herceptin maintenance every 3 weeks for 1 year (patient refused [place])        CHIEF COMPLIANT: Cycle 12 Taxol Herceptin  INTERVAL HISTORY: Adriana Jones is a 62 year old with above-mentioned history of breast cancer treated with mastectomy and is currently in adjuvant chemotherapy with Taxol and Herceptin.  She has tolerated the treatment extremely well.  She did not have any nausea vomiting.  She had a few more days of fatigue after the last few rounds.  Denies any neuropathy.  She has had hypertension for which she is on metoprolol 25 p.o. twice daily.  She met with Dr. Haroldine Laws with cardiology who performed echocardiogram and reported an EF of 60-65%.  REVIEW OF SYSTEMS:   Constitutional: Denies fevers, chills or abnormal weight loss Eyes: Denies blurriness of vision Ears, nose, mouth, throat, and face: Denies mucositis or sore throat Respiratory: Denies cough,  dyspnea or wheezes Cardiovascular: Denies palpitation, chest discomfort Gastrointestinal:  Denies nausea, heartburn or change in bowel habits Skin: Denies abnormal skin rashes Lymphatics: Denies new lymphadenopathy or easy bruising Neurological:Denies numbness, tingling or new weaknesses Behavioral/Psych: Mood is stable, no new changes  Extremities: No lower extremity edema  All other systems were reviewed with the patient and are negative.  I have reviewed the past medical history, past surgical history, social history and family history with the patient and they are unchanged from previous note.  ALLERGIES:  is allergic to amoxicillin and sulfa antibiotics.  MEDICATIONS:  Current Outpatient Medications  Medication Sig Dispense Refill  . Ascorbic Acid (VITAMIN C) 1000 MG tablet Take 1,000 mg by mouth daily.    . cholecalciferol 2000 units tablet Take 2 tablets (4,000 Units total) by mouth daily.    . metoprolol tartrate (LOPRESSOR) 25 MG tablet Take 1 tablet (25 mg total) by mouth daily. (Patient taking differently: Take 25 mg by mouth daily. Takes 0.5 tablet BID) 30 tablet 0  . Probiotic Product (PROBIOTIC PO) Take by mouth daily.    Marland Kitchen UNABLE TO FIND 100 mg 2 (two) times daily. EMPOWER     No current facility-administered medications for this visit.     PHYSICAL EXAMINATION: ECOG PERFORMANCE STATUS: 1 - Symptomatic but completely ambulatory  Vitals:   08/05/17 0916  BP: (!) 162/81  Pulse: 63  Resp: 18  Temp: 97.9 F (36.6 C)  SpO2: 99%   Filed Weights   08/05/17 0916  Weight:  142 lb 11.2 oz (64.7 kg)    GENERAL:alert, no distress and comfortable SKIN: skin color, texture, turgor are normal, no rashes or significant lesions EYES: normal, Conjunctiva are pink and non-injected, sclera clear OROPHARYNX:no exudate, no erythema and lips, buccal mucosa, and tongue normal  NECK: supple, thyroid normal size, non-tender, without nodularity LYMPH:  no palpable lymphadenopathy  in the cervical, axillary or inguinal LUNGS: clear to auscultation and percussion with normal breathing effort HEART: regular rate & rhythm and no murmurs and no lower extremity edema ABDOMEN:abdomen soft, non-tender and normal bowel sounds MUSCULOSKELETAL:no cyanosis of digits and no clubbing  NEURO: alert & oriented x 3 with fluent speech, no focal motor/sensory deficits EXTREMITIES: No lower extremity edema  LABORATORY DATA:  I have reviewed the data as listed CMP Latest Ref Rng & Units 07/29/2017 07/22/2017 07/15/2017  Glucose 70 - 140 mg/dL 104 88 101  BUN 7 - 26 mg/dL '14 10 10  '$ Creatinine 0.60 - 1.10 mg/dL 0.68 0.67 0.66  Sodium 136 - 145 mmol/L 141 140 140  Potassium 3.5 - 5.1 mmol/L 4.3 4.5 4.3  Chloride 98 - 109 mmol/L 109 108 108  CO2 22 - 29 mmol/L '25 25 25  '$ Calcium 8.4 - 10.4 mg/dL 9.0 9.3 9.1  Total Protein 6.4 - 8.3 g/dL 6.7 6.7 6.7  Total Bilirubin 0.2 - 1.2 mg/dL 0.4 0.3 0.4  Alkaline Phos 40 - 150 U/L 86 87 82  AST 5 - 34 U/L '19 17 15  '$ ALT 0 - 55 U/L '17 16 17    '$ Lab Results  Component Value Date   WBC 4.4 08/05/2017   HGB 11.3 (L) 08/05/2017   HCT 33.3 (L) 08/05/2017   MCV 92.3 08/05/2017   PLT 304 08/05/2017   NEUTROABS 2.8 08/05/2017    ASSESSMENT & PLAN:  Malignant neoplasm of lower-inner quadrant of left breast in female, estrogen receptor positive ([facility]) 04/04/2017:Left mastectomy: IDC grade 3, 3.8 cm, intermediate grade DCIS, invasive cancer comes 0.1 cm to posterior margin and less than 0.1 cm to anterior inferior margin at 0/13 lymph nodes, ER 100%, PR 0%, HER-2 positive ratio 2.08 T2 N0 stage IIb  Treatment plan: 1.Adjuvant chemotherapywithTHfollowed by Herceptin maintenance for 1 year. 2. Adjuvant antiestrogen therapy --------------------------------------------------------- Current treatment: Cycle 12 day 1 Taxol Herceptin weekly Patient plans to use marijuana daily to decrease adverse effects of chemo ECHO11/09/2016: EF 60-65% Patient  taking probiotics.  This concludes patient's chemotherapy.  I recommended continuation of Herceptin treatment every 3 weeks.  HTN: Currently on metoprolol 25 mg twice daily Patient does not want to continue with metoprolol.  She wishes to stop it after 2 weeks.  Return to clinic every 3 weeks for Herceptin every 6 weeks for follow-up with me with labs When she comes back in 6 weeks we will discuss starting antiestrogen therapy.  I spent 25 minutes talking to the patient of which more than half was spent in counseling and coordination of care.  No orders of the defined types were placed in this encounter.  The patient has a good understanding of the overall plan. she agrees with it. she will call with any problems that may develop before the next visit here.   Harriette Ohara, MD 08/05/17

## 2017-08-05 NOTE — Patient Instructions (Signed)

## 2017-08-05 NOTE — Assessment & Plan Note (Signed)
04/04/2017:Left mastectomy: IDC grade 3, 3.8 cm, intermediate grade DCIS, invasive cancer comes 0.1 cm to posterior margin and less than 0.1 cm to anterior inferior margin at 0/13 lymph nodes, ER 100%, PR 0%, HER-2 positive ratio 2.08 T2 N0 stage IIb  Treatment plan: 1.Adjuvant chemotherapywithTHfollowed by Herceptin maintenance for 1 year. 2. Adjuvant radiation therapyif necessary based upon final pathology 3. Adjuvant antiestrogen therapy --------------------------------------------------------- Current treatment: Cycle 12 day 1 Taxol Herceptin weekly Patient plans to use marijuana daily to decrease adverse effects of chemo ECHO11/09/2016: EF 60-65% Patient taking probiotics.  This concludes patient's chemotherapy.  I recommended continuation of Herceptin treatment every 3 weeks.  HTN: Currently on metoprolol 25 mg twice daily Return to clinic every 3 weeks for Herceptin every 6 weeks for follow-up with me with labs

## 2017-08-26 ENCOUNTER — Inpatient Hospital Stay: Payer: BLUE CROSS/BLUE SHIELD

## 2017-08-26 ENCOUNTER — Inpatient Hospital Stay: Payer: BLUE CROSS/BLUE SHIELD | Attending: Hematology and Oncology

## 2017-08-26 VITALS — BP 152/89 | HR 79 | Temp 98.6°F | Resp 18

## 2017-08-26 DIAGNOSIS — Z9012 Acquired absence of left breast and nipple: Secondary | ICD-10-CM | POA: Diagnosis not present

## 2017-08-26 DIAGNOSIS — Z170 Estrogen receptor positive status [ER+]: Principal | ICD-10-CM

## 2017-08-26 DIAGNOSIS — C50312 Malignant neoplasm of lower-inner quadrant of left female breast: Secondary | ICD-10-CM | POA: Insufficient documentation

## 2017-08-26 DIAGNOSIS — Z5112 Encounter for antineoplastic immunotherapy: Secondary | ICD-10-CM | POA: Insufficient documentation

## 2017-08-26 DIAGNOSIS — F419 Anxiety disorder, unspecified: Secondary | ICD-10-CM | POA: Diagnosis not present

## 2017-08-26 DIAGNOSIS — Z95828 Presence of other vascular implants and grafts: Secondary | ICD-10-CM

## 2017-08-26 DIAGNOSIS — Z87891 Personal history of nicotine dependence: Secondary | ICD-10-CM | POA: Insufficient documentation

## 2017-08-26 LAB — CBC WITH DIFFERENTIAL/PLATELET
Basophils Absolute: 0.1 10*3/uL (ref 0.0–0.1)
Basophils Relative: 1 %
EOS ABS: 0.1 10*3/uL (ref 0.0–0.5)
Eosinophils Relative: 2 %
HEMATOCRIT: 35.4 % (ref 34.8–46.6)
HEMOGLOBIN: 11.9 g/dL (ref 11.6–15.9)
LYMPHS ABS: 1.1 10*3/uL (ref 0.9–3.3)
Lymphocytes Relative: 24 %
MCH: 31.1 pg (ref 25.1–34.0)
MCHC: 33.5 g/dL (ref 31.5–36.0)
MCV: 92.6 fL (ref 79.5–101.0)
MONO ABS: 0.6 10*3/uL (ref 0.1–0.9)
MONOS PCT: 13 %
NEUTROS PCT: 60 %
Neutro Abs: 2.7 10*3/uL (ref 1.5–6.5)
Platelets: 227 10*3/uL (ref 145–400)
RBC: 3.82 MIL/uL (ref 3.70–5.45)
RDW: 14.6 % — AB (ref 11.2–14.5)
WBC: 4.5 10*3/uL (ref 3.9–10.3)

## 2017-08-26 LAB — COMPREHENSIVE METABOLIC PANEL
ALK PHOS: 81 U/L (ref 40–150)
ALT: 21 U/L (ref 0–55)
ANION GAP: 7 (ref 3–11)
AST: 18 U/L (ref 5–34)
Albumin: 3.7 g/dL (ref 3.5–5.0)
BILIRUBIN TOTAL: 0.4 mg/dL (ref 0.2–1.2)
BUN: 17 mg/dL (ref 7–26)
CALCIUM: 9.2 mg/dL (ref 8.4–10.4)
CO2: 25 mmol/L (ref 22–29)
Chloride: 108 mmol/L (ref 98–109)
Creatinine, Ser: 0.67 mg/dL (ref 0.60–1.10)
GFR calc non Af Amer: >60 mL/min (ref >60)
Glucose, Bld: 99 mg/dL (ref 70–140)
POTASSIUM: 4.2 mmol/L (ref 3.5–5.1)
Sodium: 140 mmol/L (ref 136–145)
TOTAL PROTEIN: 6.7 g/dL (ref 6.4–8.3)

## 2017-08-26 MED ORDER — SODIUM CHLORIDE 0.9 % IV SOLN
Freq: Once | INTRAVENOUS | Status: AC
  Administered 2017-08-26: 11:00:00 via INTRAVENOUS

## 2017-08-26 MED ORDER — DIPHENHYDRAMINE HCL 25 MG PO CAPS
50.0000 mg | ORAL_CAPSULE | Freq: Once | ORAL | Status: AC
  Administered 2017-08-26: 25 mg via ORAL

## 2017-08-26 MED ORDER — ACETAMINOPHEN 325 MG PO TABS
ORAL_TABLET | ORAL | Status: AC
  Filled 2017-08-26: qty 2

## 2017-08-26 MED ORDER — SODIUM CHLORIDE 0.9% FLUSH
10.0000 mL | INTRAVENOUS | Status: DC | PRN
  Administered 2017-08-26: 10 mL via INTRAVENOUS
  Filled 2017-08-26: qty 10

## 2017-08-26 MED ORDER — TRASTUZUMAB CHEMO 150 MG IV SOLR
6.0000 mg/kg | Freq: Once | INTRAVENOUS | Status: AC
  Administered 2017-08-26: 378 mg via INTRAVENOUS
  Filled 2017-08-26: qty 18

## 2017-08-26 MED ORDER — ACETAMINOPHEN 325 MG PO TABS
650.0000 mg | ORAL_TABLET | Freq: Once | ORAL | Status: AC
  Administered 2017-08-26: 650 mg via ORAL

## 2017-08-26 MED ORDER — HEPARIN SOD (PORK) LOCK FLUSH 100 UNIT/ML IV SOLN
500.0000 [IU] | Freq: Once | INTRAVENOUS | Status: AC | PRN
  Administered 2017-08-26: 500 [IU]
  Filled 2017-08-26: qty 5

## 2017-08-26 MED ORDER — SODIUM CHLORIDE 0.9% FLUSH
10.0000 mL | INTRAVENOUS | Status: DC | PRN
  Administered 2017-08-26: 10 mL
  Filled 2017-08-26: qty 10

## 2017-08-26 MED ORDER — DIPHENHYDRAMINE HCL 25 MG PO CAPS
ORAL_CAPSULE | ORAL | Status: AC
  Filled 2017-08-26: qty 1

## 2017-08-26 NOTE — Patient Instructions (Signed)

## 2017-08-26 NOTE — Patient Instructions (Signed)
[facility] Discharge Instructions for Patients Receiving Chemotherapy  Today you received the following chemotherapy agents Herceptin  To help prevent nausea and vomiting after your treatment, we encourage you to take your nausea medication as directed   If you develop nausea and vomiting that is not controlled by your nausea medication, call the clinic.   BELOW ARE SYMPTOMS THAT SHOULD BE REPORTED IMMEDIATELY:  *FEVER GREATER THAN 100.5 F  *CHILLS WITH OR WITHOUT FEVER  NAUSEA AND VOMITING THAT IS NOT CONTROLLED WITH YOUR NAUSEA MEDICATION  *UNUSUAL SHORTNESS OF BREATH  *UNUSUAL BRUISING OR BLEEDING  TENDERNESS IN MOUTH AND THROAT WITH OR WITHOUT PRESENCE OF ULCERS  *URINARY PROBLEMS  *BOWEL PROBLEMS  UNUSUAL RASH Items with * indicate a potential emergency and should be followed up as soon as possible.  Feel free to call the clinic should you have any questions or concerns. The clinic phone number is [phone].  Please show the CHEMO ALERT CARD at check-in to the Emergency Department and triage nurse.   

## 2017-09-08 ENCOUNTER — Telehealth (HOSPITAL_COMMUNITY): Payer: Self-pay | Admitting: Vascular Surgery

## 2017-09-08 NOTE — Telephone Encounter (Signed)
Left pt message to reschedule echo and f/u appt w./ DB , due to DB not going to be in the office 6/19

## 2017-09-16 ENCOUNTER — Inpatient Hospital Stay: Payer: BLUE CROSS/BLUE SHIELD

## 2017-09-16 ENCOUNTER — Inpatient Hospital Stay (HOSPITAL_BASED_OUTPATIENT_CLINIC_OR_DEPARTMENT_OTHER): Payer: BLUE CROSS/BLUE SHIELD | Admitting: Adult Health

## 2017-09-16 VITALS — BP 170/84 | HR 63 | Temp 98.2°F | Resp 17 | Ht 64.0 in | Wt 142.4 lb

## 2017-09-16 DIAGNOSIS — F419 Anxiety disorder, unspecified: Secondary | ICD-10-CM

## 2017-09-16 DIAGNOSIS — Z87891 Personal history of nicotine dependence: Secondary | ICD-10-CM

## 2017-09-16 DIAGNOSIS — C50312 Malignant neoplasm of lower-inner quadrant of left female breast: Secondary | ICD-10-CM | POA: Diagnosis not present

## 2017-09-16 DIAGNOSIS — Z9012 Acquired absence of left breast and nipple: Secondary | ICD-10-CM | POA: Diagnosis not present

## 2017-09-16 DIAGNOSIS — Z170 Estrogen receptor positive status [ER+]: Secondary | ICD-10-CM

## 2017-09-16 LAB — COMPREHENSIVE METABOLIC PANEL
ALBUMIN: 3.8 g/dL (ref 3.5–5.0)
ALK PHOS: 83 U/L (ref 40–150)
ALT: 20 U/L (ref 0–55)
AST: 20 U/L (ref 5–34)
Anion gap: 8 (ref 3–11)
BILIRUBIN TOTAL: 0.4 mg/dL (ref 0.2–1.2)
BUN: 13 mg/dL (ref 7–26)
CALCIUM: 9.4 mg/dL (ref 8.4–10.4)
CO2: 25 mmol/L (ref 22–29)
Chloride: 109 mmol/L (ref 98–109)
Creatinine, Ser: 0.67 mg/dL (ref 0.60–1.10)
GFR calc Af Amer: >60 mL/min (ref >60)
GLUCOSE: 94 mg/dL (ref 70–140)
Potassium: 4.1 mmol/L (ref 3.5–5.1)
Sodium: 142 mmol/L (ref 136–145)
TOTAL PROTEIN: 7.1 g/dL (ref 6.4–8.3)

## 2017-09-16 LAB — CBC WITH DIFFERENTIAL/PLATELET
BASOS ABS: 0.1 10*3/uL (ref 0.0–0.1)
BASOS PCT: 1 %
EOS PCT: 2 %
Eosinophils Absolute: 0.1 10*3/uL (ref 0.0–0.5)
HCT: 36 % (ref 34.8–46.6)
Hemoglobin: 11.8 g/dL (ref 11.6–15.9)
LYMPHS PCT: 24 %
Lymphs Abs: 1 10*3/uL (ref 0.9–3.3)
MCH: 30.3 pg (ref 25.1–34.0)
MCHC: 32.8 g/dL (ref 31.5–36.0)
MCV: 92.4 fL (ref 79.5–101.0)
MONO ABS: 0.5 10*3/uL (ref 0.1–0.9)
Monocytes Relative: 12 %
NEUTROS ABS: 2.7 10*3/uL (ref 1.5–6.5)
Neutrophils Relative %: 61 %
PLATELETS: 260 10*3/uL (ref 145–400)
RBC: 3.9 MIL/uL (ref 3.70–5.45)
RDW: 13.5 % (ref 11.2–14.5)
WBC: 4.5 10*3/uL (ref 3.9–10.3)

## 2017-09-16 MED ORDER — ACETAMINOPHEN 325 MG PO TABS
650.0000 mg | ORAL_TABLET | Freq: Once | ORAL | Status: AC
  Administered 2017-09-16: 650 mg via ORAL

## 2017-09-16 MED ORDER — SODIUM CHLORIDE 0.9% FLUSH
10.0000 mL | INTRAVENOUS | Status: DC | PRN
  Administered 2017-09-16: 10 mL
  Filled 2017-09-16: qty 10

## 2017-09-16 MED ORDER — SODIUM CHLORIDE 0.9 % IV SOLN
Freq: Once | INTRAVENOUS | Status: AC
  Administered 2017-09-16: 14:00:00 via INTRAVENOUS

## 2017-09-16 MED ORDER — TRASTUZUMAB CHEMO 150 MG IV SOLR
6.0000 mg/kg | Freq: Once | INTRAVENOUS | Status: AC
  Administered 2017-09-16: 378 mg via INTRAVENOUS
  Filled 2017-09-16: qty 18

## 2017-09-16 MED ORDER — DIPHENHYDRAMINE HCL 25 MG PO CAPS
25.0000 mg | ORAL_CAPSULE | Freq: Once | ORAL | Status: AC
  Administered 2017-09-16: 25 mg via ORAL

## 2017-09-16 MED ORDER — ACETAMINOPHEN 325 MG PO TABS
ORAL_TABLET | ORAL | Status: AC
  Filled 2017-09-16: qty 2

## 2017-09-16 MED ORDER — HEPARIN SOD (PORK) LOCK FLUSH 100 UNIT/ML IV SOLN
500.0000 [IU] | Freq: Once | INTRAVENOUS | Status: AC | PRN
  Administered 2017-09-16: 500 [IU]
  Filled 2017-09-16: qty 5

## 2017-09-16 MED ORDER — DIPHENHYDRAMINE HCL 25 MG PO CAPS
ORAL_CAPSULE | ORAL | Status: AC
  Filled 2017-09-16: qty 2

## 2017-09-16 MED ORDER — SODIUM CHLORIDE 0.9 % IJ SOLN
10.0000 mL | Freq: Once | INTRAMUSCULAR | Status: AC
  Administered 2017-09-16: 10 mL via INTRAVENOUS
  Filled 2017-09-16: qty 10

## 2017-09-16 NOTE — Patient Instructions (Signed)

## 2017-09-16 NOTE — Progress Notes (Signed)
Decreased Benadryl to 25mg  po per patient request.  Raul Del, PharmD, BCPS, BCOP

## 2017-09-16 NOTE — Progress Notes (Signed)
[facility] Follow up:    Adriana Posey, MD [address] [place] [address]   DIAGNOSIS: Cancer Staging Malignant neoplasm of lower-inner quadrant of left breast in female, estrogen receptor positive ([place]) Staging form: Breast, AJCC 8th Edition - Clinical stage from 03/09/2017: Stage IIA (cT2, cN0, cM0, G2, ER: Positive, PR: Negative, HER2: Positive) - Unsigned - Pathologic: Stage IIA (pT2, pN0, cM0, G3, ER: Positive, PR: Negative, HER2: Positive) - Unsigned   SUMMARY OF ONCOLOGIC HISTORY:   Malignant neoplasm of lower-inner quadrant of left breast in female, estrogen receptor positive ([place])   02/28/2017 Initial Diagnosis    Palpable left breast masses 2.5 cm at 7:00 and another 2.5 cm at 6:00 1.9 cm apart biopsy: IDC grade 2 with DCISER 100%, PR 0%, Ki-67 20%, HER-2 positiveratio 2.08, axilla negative; in addition indeterminate calcifications medially and laterally,T2 N0 stage 2A clinical stage AJCC 8      04/04/2017 Surgery    Left mastectomy: IDC grade 3, 3.8 cm, intermediate grade DCIS, invasive cancer comes 0.1 cm to posterior margin and less than 0.1 cm to anterior inferior margin at 0/13 lymph nodes, ER 100%, PR 0%, HER-2 positive ratio 2.08 T2 N0 stage IIb      05/20/2017 -  Chemotherapy    Taxol Herceptin weekly x12 followed by Herceptin maintenance every 3 weeks for 1 year (patient refused [facility])        CURRENT THERAPY: herceptin  INTERVAL HISTORY: Adriana Jones 62 y.o. female returns for evaluation prior to receiving herceptin.  She is doing well today.  Her BP is elevated today.  She has reviewed this with Dr. Vaughan Browner.  She does check her bp at home and she tells me that it varies and is typically lower.  The lowest is 678 systolic.  She is doing well otherwise.     Patient Active Problem List   Diagnosis Date Noted  . Port-A-Cath in place 06/03/2017  . Malignant neoplasm of lower-inner quadrant of left breast in female,  estrogen receptor positive ([place]) 03/03/2017    is allergic to amoxicillin and sulfa antibiotics.  MEDICAL HISTORY: Past Medical History:  Diagnosis Date  . Anxiety   . Cancer [facility])    breast  . Headache     SURGICAL HISTORY: Past Surgical History:  Procedure Laterality Date  . BREAST EXCISIONAL BIOPSY    . PORTACATH PLACEMENT N/A 04/04/2017   Procedure: INSERTION PORT-A-CATH;  Surgeon: Jovita Kussmaul, MD;  Location: [place];  Service: General;  Laterality: N/A;  . SIMPLE MASTECTOMY WITH AXILLARY SENTINEL NODE BIOPSY Left 04/04/2017   Procedure: LEFT MASTECTOMY WITH LEFT SENTINEL LYMPH NODE BIOPSY;  Surgeon: Jovita Kussmaul, MD;  Location: [place];  Service: General;  Laterality: Left;    SOCIAL HISTORY: Social History   Socioeconomic History  . Marital status: Married    Spouse name: Not on file  . Number of children: Not on file  . Years of education: Not on file  . Highest education level: Not on file  Occupational History  . Not on file  Social Needs  . Financial resource strain: Not on file  . Food insecurity:    Worry: Not on file    Inability: Not on file  . Transportation needs:    Medical: Not on file    Non-medical: Not on file  Tobacco Use  . 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 Smokeless tobacco: Never Used  Substance and Sexual Activity  . Alcohol use: Yes    Comment: 4-10/wk  . Drug use: No  . Sexual activity: Not on file  Lifestyle  . Physical activity:    Days per week: Not on file    Minutes per session: Not on file  . Stress: Not on file  Relationships  . Social connections:    Talks on phone: Not on file    Gets together: Not on file    Attends religious service: Not on file    Active member of club or organization: Not on file    Attends meetings of clubs or organizations: Not on file    Relationship status: Not on file  . Intimate partner violence:    Fear of current or ex  partner: Not on file    Emotionally abused: Not on file    Physically abused: Not on file    Forced sexual activity: Not on file  Other Topics Concern  . Not on file  Social History Narrative  . Not on file    FAMILY HISTORY: No family history on file.  Review of Systems  Constitutional: Negative for appetite change, chills, fatigue and fever.  HENT:   Negative for hearing loss, lump/mass and trouble swallowing.   Eyes: Negative for eye problems and icterus.  Respiratory: Negative for chest tightness, cough and shortness of breath.   Cardiovascular: Negative for chest pain, leg swelling and palpitations.  Gastrointestinal: Negative for abdominal distention, abdominal pain, constipation, diarrhea, nausea and vomiting.  Endocrine: Negative for hot flashes.  Skin: Negative for itching and rash.  Neurological: Negative for dizziness, extremity weakness and numbness.  Psychiatric/Behavioral: Positive for sleep disturbance.      PHYSICAL EXAMINATION  ECOG PERFORMANCE STATUS: 1 - Symptomatic but completely ambulatory  Vitals:   09/16/17 1305  BP: (!) 170/84  Pulse: 63  Resp: 17  Temp: 98.2 F (36.8 C)  SpO2: 100%    Physical Exam  Constitutional: She is oriented to person, place, and time. She appears well-developed and well-nourished.  HENT:  Head: Normocephalic and atraumatic.  Mouth/Throat: Oropharynx is clear and moist. No oropharyngeal exudate.  Eyes: Pupils are equal, round, and reactive to light. No scleral icterus.  Neck: Neck supple.  Cardiovascular: Normal rate, regular rhythm, normal heart sounds and intact distal pulses.  Pulmonary/Chest: Effort normal and breath sounds normal.  Abdominal: Soft. Bowel sounds are normal. She exhibits no distension and no mass. There is no tenderness. There is no rebound and no guarding.  Musculoskeletal: She exhibits no edema.  Lymphadenopathy:    She has no cervical adenopathy.  Neurological: She is alert and oriented to  person, place, and time.  Skin: Skin is warm and dry.  Psychiatric: She has a normal mood and affect. Her behavior is normal.    LABORATORY DATA:  CBC    Component Value Date/Time   WBC 4.5 09/16/2017 1218   RBC 3.90 09/16/2017 1218   HGB 11.8 09/16/2017 1218   HGB 12.3 05/27/2017 1130   HCT 36.0 09/16/2017 1218   HCT 37.1 05/27/2017 1130   PLT 260 09/16/2017 1218   PLT 229 05/27/2017 1130   MCV 92.4 09/16/2017 1218   MCV 90.9 05/27/2017 1130   MCH 30.3 09/16/2017 1218   MCHC 32.8 09/16/2017 1218   RDW 13.5 09/16/2017 1218   RDW 13.5 05/27/2017 1130   LYMPHSABS 1.0 09/16/2017 1218   LYMPHSABS 0.9 05/27/2017 1130  MONOABS 0.5 09/16/2017 1218   MONOABS 0.4 05/27/2017 1130   EOSABS 0.1 09/16/2017 1218   EOSABS 0.0 05/27/2017 1130   BASOSABS 0.1 09/16/2017 1218   BASOSABS 0.0 05/27/2017 1130    CMP     Component Value Date/Time   NA 142 09/16/2017 1218   NA 138 05/27/2017 1130   K 4.1 09/16/2017 1218   K 4.3 05/27/2017 1130   CL 109 09/16/2017 1218   CO2 25 09/16/2017 1218   CO2 24 05/27/2017 1130   GLUCOSE 94 09/16/2017 1218   GLUCOSE 105 05/27/2017 1130   BUN 13 09/16/2017 1218   BUN 11.3 05/27/2017 1130   CREATININE 0.67 09/16/2017 1218   CREATININE 0.7 05/27/2017 1130   CALCIUM 9.4 09/16/2017 1218   CALCIUM 9.1 05/27/2017 1130   PROT 7.1 09/16/2017 1218   PROT 7.0 05/27/2017 1130   ALBUMIN 3.8 09/16/2017 1218   ALBUMIN 3.9 05/27/2017 1130   AST 20 09/16/2017 1218   AST 22 05/27/2017 1130   ALT 20 09/16/2017 1218   ALT 40 05/27/2017 1130   ALKPHOS 83 09/16/2017 1218   ALKPHOS 95 05/27/2017 1130   BILITOT 0.4 09/16/2017 1218   BILITOT 0.55 05/27/2017 1130   GFRNONAA >60 09/16/2017 1218   GFRAA >60 09/16/2017 1218      ASSESSMENT and PLAN:   Malignant neoplasm of lower-inner quadrant of left breast in female, estrogen receptor positive ([facility]) 04/04/2017:Left mastectomy: IDC grade 3, 3.8 cm, intermediate grade DCIS, invasive cancer comes 0.1 cm to  posterior margin and less than 0.1 cm to anterior inferior margin at 0/13 lymph nodes, ER 100%, PR 0%, HER-2 positive ratio 2.08 T2 N0 stage IIb  Treatment plan: 1.Adjuvant chemotherapywithTHfollowed by Herceptin maintenance for 1 year. 2. Adjuvant radiation therapyif necessary based upon final pathology 3. Adjuvant antiestrogen therapy --------------------------------------------------------- Current treatment: Herceptin Patient plans to use marijuana daily to decrease adverse effects of chemo ECHO11/09/2016: EF 60-65% ECHO 08/03/2017: EF 60-65% Patient taking probiotics.  Adriana Jones is doing well today.  We reviewed her blood pressure.  She has stopped the Metoprolol.  She will f/u with Dr. Haroldine Laws about this.  She will receive Herceptin today.  She has told me that she is not going to take anti estrogens.  I recommend she review this with Dr. Lindi Adie.       All questions were answered. The patient knows to call the clinic with any problems, questions or concerns. We can certainly see the patient much sooner if necessary.  A total of (20) minutes of face-to-face time was spent with this patient with greater than 50% of that time in counseling and care-coordination.  This note was electronically signed. Scot Dock, NP 09/17/2017

## 2017-09-17 ENCOUNTER — Encounter: Payer: Self-pay | Admitting: Adult Health

## 2017-09-17 NOTE — Assessment & Plan Note (Addendum)
04/04/2017:Left mastectomy: IDC grade 3, 3.8 cm, intermediate grade DCIS, invasive cancer comes 0.1 cm to posterior margin and less than 0.1 cm to anterior inferior margin at 0/13 lymph nodes, ER 100%, PR 0%, HER-2 positive ratio 2.08 T2 N0 stage IIb  Treatment plan: 1.Adjuvant chemotherapywithTHfollowed by Herceptin maintenance for 1 year. 2. Adjuvant radiation therapyif necessary based upon final pathology 3. Adjuvant antiestrogen therapy --------------------------------------------------------- Current treatment: Herceptin Patient plans to use marijuana daily to decrease adverse effects of chemo ECHO11/09/2016: EF 60-65% ECHO 08/03/2017: EF 60-65% Patient taking probiotics.  Adriana Jones is doing well today.  We reviewed her blood pressure.  She has stopped the Metoprolol.  She will f/u with Dr. Bensimhon about this.  She will receive Herceptin today.  She has told me that she is not going to take anti estrogens.  I recommend she review this with Dr. Gudena.     

## 2017-09-19 ENCOUNTER — Telehealth: Payer: Self-pay | Admitting: Adult Health

## 2017-09-19 NOTE — Telephone Encounter (Signed)
Per 4/26 no los

## 2017-10-05 ENCOUNTER — Other Ambulatory Visit: Payer: Self-pay | Admitting: Hematology and Oncology

## 2017-10-07 ENCOUNTER — Inpatient Hospital Stay: Payer: BLUE CROSS/BLUE SHIELD

## 2017-10-07 ENCOUNTER — Inpatient Hospital Stay: Payer: BLUE CROSS/BLUE SHIELD | Attending: Hematology and Oncology

## 2017-10-07 VITALS — BP 143/82 | HR 64 | Temp 97.8°F | Resp 18

## 2017-10-07 DIAGNOSIS — Z79899 Other long term (current) drug therapy: Secondary | ICD-10-CM | POA: Insufficient documentation

## 2017-10-07 DIAGNOSIS — Z5112 Encounter for antineoplastic immunotherapy: Secondary | ICD-10-CM | POA: Diagnosis not present

## 2017-10-07 DIAGNOSIS — Z170 Estrogen receptor positive status [ER+]: Secondary | ICD-10-CM

## 2017-10-07 DIAGNOSIS — C50312 Malignant neoplasm of lower-inner quadrant of left female breast: Secondary | ICD-10-CM

## 2017-10-07 DIAGNOSIS — Z95828 Presence of other vascular implants and grafts: Secondary | ICD-10-CM

## 2017-10-07 LAB — COMPREHENSIVE METABOLIC PANEL
ALT: 22 U/L (ref 0–55)
AST: 21 U/L (ref 5–34)
Albumin: 4.2 g/dL (ref 3.5–5.0)
Alkaline Phosphatase: 82 U/L (ref 40–150)
Anion gap: 7 (ref 3–11)
BILIRUBIN TOTAL: 0.4 mg/dL (ref 0.2–1.2)
BUN: 23 mg/dL (ref 7–26)
CO2: 22 mmol/L (ref 22–29)
CREATININE: 0.71 mg/dL (ref 0.60–1.10)
Calcium: 9.3 mg/dL (ref 8.4–10.4)
Chloride: 109 mmol/L (ref 98–109)
GFR calc Af Amer: >60 mL/min (ref >60)
Glucose, Bld: 95 mg/dL (ref 70–140)
Potassium: 4.2 mmol/L (ref 3.5–5.1)
Sodium: 138 mmol/L (ref 136–145)
TOTAL PROTEIN: 7.2 g/dL (ref 6.4–8.3)

## 2017-10-07 LAB — CBC WITH DIFFERENTIAL/PLATELET
BASOS ABS: 0 10*3/uL (ref 0.0–0.1)
BASOS PCT: 1 %
EOS PCT: 2 %
Eosinophils Absolute: 0.1 10*3/uL (ref 0.0–0.5)
HCT: 37.9 % (ref 34.8–46.6)
Hemoglobin: 12.6 g/dL (ref 11.6–15.9)
Lymphocytes Relative: 27 %
Lymphs Abs: 1 10*3/uL (ref 0.9–3.3)
MCH: 30.5 pg (ref 25.1–34.0)
MCHC: 33.3 g/dL (ref 31.5–36.0)
MCV: 91.7 fL (ref 79.5–101.0)
Monocytes Absolute: 0.5 10*3/uL (ref 0.1–0.9)
Monocytes Relative: 14 %
Neutro Abs: 2.1 10*3/uL (ref 1.5–6.5)
Neutrophils Relative %: 56 %
PLATELETS: 223 10*3/uL (ref 145–400)
RBC: 4.13 MIL/uL (ref 3.70–5.45)
RDW: 13.4 % (ref 11.2–14.5)
WBC: 3.8 10*3/uL — AB (ref 3.9–10.3)

## 2017-10-07 MED ORDER — SODIUM CHLORIDE 0.9% FLUSH
10.0000 mL | INTRAVENOUS | Status: DC | PRN
  Administered 2017-10-07: 10 mL
  Filled 2017-10-07: qty 10

## 2017-10-07 MED ORDER — ACETAMINOPHEN 325 MG PO TABS
ORAL_TABLET | ORAL | Status: AC
  Filled 2017-10-07: qty 2

## 2017-10-07 MED ORDER — SODIUM CHLORIDE 0.9 % IV SOLN
Freq: Once | INTRAVENOUS | Status: AC
  Administered 2017-10-07: 11:00:00 via INTRAVENOUS

## 2017-10-07 MED ORDER — SODIUM CHLORIDE 0.9 % IV SOLN
6.0000 mg/kg | Freq: Once | INTRAVENOUS | Status: AC
  Administered 2017-10-07: 378 mg via INTRAVENOUS
  Filled 2017-10-07: qty 18

## 2017-10-07 MED ORDER — DIPHENHYDRAMINE HCL 25 MG PO CAPS: 25.0000 mg | ORAL_CAPSULE | Freq: Once | ORAL | Status: DC

## 2017-10-07 MED ORDER — ACETAMINOPHEN 325 MG PO TABS
650.0000 mg | ORAL_TABLET | Freq: Once | ORAL | Status: AC
  Administered 2017-10-07: 650 mg via ORAL

## 2017-10-07 MED ORDER — HEPARIN SOD (PORK) LOCK FLUSH 100 UNIT/ML IV SOLN
500.0000 [IU] | Freq: Once | INTRAVENOUS | Status: AC | PRN
  Administered 2017-10-07: 500 [IU]
  Filled 2017-10-07: qty 5

## 2017-10-07 MED ORDER — SODIUM CHLORIDE 0.9% FLUSH
10.0000 mL | INTRAVENOUS | Status: DC | PRN
  Administered 2017-10-07: 10 mL via INTRAVENOUS
  Filled 2017-10-07: qty 10

## 2017-10-07 NOTE — Patient Instructions (Signed)
[facility] Discharge Instructions for Patients Receiving Chemotherapy  Today you received the following chemotherapy agents Herceptin  To help prevent nausea and vomiting after your treatment, we encourage you to take your nausea medication as directed   If you develop nausea and vomiting that is not controlled by your nausea medication, call the clinic.   BELOW ARE SYMPTOMS THAT SHOULD BE REPORTED IMMEDIATELY:  *FEVER GREATER THAN 100.5 F  *CHILLS WITH OR WITHOUT FEVER  NAUSEA AND VOMITING THAT IS NOT CONTROLLED WITH YOUR NAUSEA MEDICATION  *UNUSUAL SHORTNESS OF BREATH  *UNUSUAL BRUISING OR BLEEDING  TENDERNESS IN MOUTH AND THROAT WITH OR WITHOUT PRESENCE OF ULCERS  *URINARY PROBLEMS  *BOWEL PROBLEMS  UNUSUAL RASH Items with * indicate a potential emergency and should be followed up as soon as possible.  Feel free to call the clinic should you have any questions or concerns. The clinic phone number is [phone].  Please show the CHEMO ALERT CARD at check-in to the Emergency Department and triage nurse.   

## 2017-10-28 ENCOUNTER — Inpatient Hospital Stay: Payer: BLUE CROSS/BLUE SHIELD

## 2017-10-28 ENCOUNTER — Telehealth: Payer: Self-pay | Admitting: Hematology and Oncology

## 2017-10-28 ENCOUNTER — Inpatient Hospital Stay (HOSPITAL_BASED_OUTPATIENT_CLINIC_OR_DEPARTMENT_OTHER): Payer: BLUE CROSS/BLUE SHIELD | Admitting: Hematology and Oncology

## 2017-10-28 ENCOUNTER — Inpatient Hospital Stay: Payer: BLUE CROSS/BLUE SHIELD | Attending: Hematology and Oncology

## 2017-10-28 VITALS — BP 189/85

## 2017-10-28 DIAGNOSIS — Z9012 Acquired absence of left breast and nipple: Secondary | ICD-10-CM

## 2017-10-28 DIAGNOSIS — Z95828 Presence of other vascular implants and grafts: Secondary | ICD-10-CM

## 2017-10-28 DIAGNOSIS — Z170 Estrogen receptor positive status [ER+]: Secondary | ICD-10-CM | POA: Insufficient documentation

## 2017-10-28 DIAGNOSIS — C50312 Malignant neoplasm of lower-inner quadrant of left female breast: Secondary | ICD-10-CM

## 2017-10-28 DIAGNOSIS — Z5112 Encounter for antineoplastic immunotherapy: Secondary | ICD-10-CM | POA: Insufficient documentation

## 2017-10-28 DIAGNOSIS — Z79899 Other long term (current) drug therapy: Secondary | ICD-10-CM | POA: Diagnosis not present

## 2017-10-28 LAB — CBC WITH DIFFERENTIAL/PLATELET
Basophils Absolute: 0.1 10*3/uL (ref 0.0–0.1)
Basophils Relative: 1 %
EOS ABS: 0.1 10*3/uL (ref 0.0–0.5)
Eosinophils Relative: 3 %
HCT: 37.6 % (ref 34.8–46.6)
Hemoglobin: 12.6 g/dL (ref 11.6–15.9)
LYMPHS ABS: 1.4 10*3/uL (ref 0.9–3.3)
Lymphocytes Relative: 29 %
MCH: 30.4 pg (ref 25.1–34.0)
MCHC: 33.4 g/dL (ref 31.5–36.0)
MCV: 91 fL (ref 79.5–101.0)
MONOS PCT: 12 %
Monocytes Absolute: 0.5 10*3/uL (ref 0.1–0.9)
NEUTROS PCT: 55 %
Neutro Abs: 2.6 10*3/uL (ref 1.5–6.5)
Platelets: 218 10*3/uL (ref 145–400)
RBC: 4.13 MIL/uL (ref 3.70–5.45)
RDW: 13.3 % (ref 11.2–14.5)
WBC: 4.8 10*3/uL (ref 3.9–10.3)

## 2017-10-28 LAB — COMPREHENSIVE METABOLIC PANEL
ALT: 18 U/L (ref 0–55)
AST: 19 U/L (ref 5–34)
Albumin: 3.9 g/dL (ref 3.5–5.0)
Alkaline Phosphatase: 93 U/L (ref 40–150)
Anion gap: 9 (ref 3–11)
BUN: 12 mg/dL (ref 7–26)
CHLORIDE: 108 mmol/L (ref 98–109)
CO2: 24 mmol/L (ref 22–29)
CREATININE: 0.69 mg/dL (ref 0.60–1.10)
Calcium: 8.9 mg/dL (ref 8.4–10.4)
GFR calc Af Amer: >60 mL/min (ref >60)
GFR calc non Af Amer: >60 mL/min (ref >60)
GLUCOSE: 93 mg/dL (ref 70–140)
Potassium: 4 mmol/L (ref 3.5–5.1)
Sodium: 141 mmol/L (ref 136–145)
Total Bilirubin: 0.2 mg/dL (ref 0.2–1.2)
Total Protein: 6.9 g/dL (ref 6.4–8.3)

## 2017-10-28 MED ORDER — SODIUM CHLORIDE 0.9 % IV SOLN
Freq: Once | INTRAVENOUS | Status: AC
  Administered 2017-10-28: 11:00:00 via INTRAVENOUS

## 2017-10-28 MED ORDER — ACETAMINOPHEN 325 MG PO TABS
650.0000 mg | ORAL_TABLET | Freq: Once | ORAL | Status: AC
  Administered 2017-10-28: 650 mg via ORAL

## 2017-10-28 MED ORDER — HEPARIN SOD (PORK) LOCK FLUSH 100 UNIT/ML IV SOLN
500.0000 [IU] | Freq: Once | INTRAVENOUS | Status: AC | PRN
  Administered 2017-10-28: 500 [IU]
  Filled 2017-10-28: qty 5

## 2017-10-28 MED ORDER — TRASTUZUMAB CHEMO 150 MG IV SOLR
6.0000 mg/kg | Freq: Once | INTRAVENOUS | Status: AC
  Administered 2017-10-28: 378 mg via INTRAVENOUS
  Filled 2017-10-28: qty 18

## 2017-10-28 MED ORDER — SODIUM CHLORIDE 0.9% FLUSH
10.0000 mL | INTRAVENOUS | Status: DC | PRN
  Administered 2017-10-28: 10 mL
  Filled 2017-10-28: qty 10

## 2017-10-28 MED ORDER — DIPHENHYDRAMINE HCL 25 MG PO CAPS
ORAL_CAPSULE | ORAL | Status: AC
  Filled 2017-10-28: qty 2

## 2017-10-28 MED ORDER — DIPHENHYDRAMINE HCL 25 MG PO CAPS: 25.0000 mg | ORAL_CAPSULE | Freq: Once | ORAL | Status: DC

## 2017-10-28 MED ORDER — ACETAMINOPHEN 325 MG PO TABS
ORAL_TABLET | ORAL | Status: AC
  Filled 2017-10-28: qty 2

## 2017-10-28 MED ORDER — SODIUM CHLORIDE 0.9% FLUSH
10.0000 mL | INTRAVENOUS | Status: DC | PRN
  Administered 2017-10-28: 10 mL via INTRAVENOUS
  Filled 2017-10-28: qty 10

## 2017-10-28 NOTE — Progress Notes (Signed)
Ok to not take benadryl per Dr. Lindi Adie.  [place] to treat with todays blood pressure per Dr. Lindi Adie.  Cyndia Bent RN

## 2017-10-28 NOTE — Assessment & Plan Note (Signed)
04/04/2017:Left mastectomy: IDC grade 3, 3.8 cm, intermediate grade DCIS, invasive cancer comes 0.1 cm to posterior margin and less than 0.1 cm to anterior inferior margin at 0/13 lymph nodes, ER 100%, PR 0%, HER-2 positive ratio 2.08 T2 N0 stage IIb  Treatment plan: 1.Adjuvant chemotherapywithTHfollowed by Herceptin maintenance for 1 year. 2. Adjuvant antiestrogen therapy --------------------------------------------------------- Current treatment: Herceptinto be completed December 2019 ECHO11/09/2016: EF 60-65% ECHO 08/03/2017: EF 60-65% Patient taking probiotics. Herceptin toxicities: None  Antiestrogen therapy counseling: I discussed with her that she would benefit from antiestrogen therapy based on her tumor being estrogen receptor positive.

## 2017-10-28 NOTE — Progress Notes (Signed)
Recheck BP 189/85. Pt states, "BP always high at doctor's office. My BP at home 130's over 60's." Pt denies dizziness, HA, or light-headedness. Dr. Lindi Adie ok to tx with BP 189/85.

## 2017-10-28 NOTE — Telephone Encounter (Signed)
Patient declined avs and calendar  °

## 2017-10-28 NOTE — Progress Notes (Signed)
Patient Care Team: Molli Posey, MD as PCP - General (Obstetrics and Gynecology)  DIAGNOSIS:  Encounter Diagnosis  Name Primary?  . Malignant neoplasm of lower-inner quadrant of left breast in female, estrogen receptor positive ([place])     SUMMARY OF ONCOLOGIC HISTORY:   Malignant neoplasm of lower-inner quadrant of left breast in female, estrogen receptor positive ([place])   02/28/2017 Initial Diagnosis    Palpable left breast masses 2.5 cm at 7:00 and another 2.5 cm at 6:00 1.9 cm apart biopsy: IDC grade 2 with DCISER 100%, PR 0%, Ki-67 20%, HER-2 positiveratio 2.08, axilla negative; in addition indeterminate calcifications medially and laterally,T2 N0 stage 2A clinical stage AJCC 8      04/04/2017 Surgery    Left mastectomy: IDC grade 3, 3.8 cm, intermediate grade DCIS, invasive cancer comes 0.1 cm to posterior margin and less than 0.1 cm to anterior inferior margin at 0/13 lymph nodes, ER 100%, PR 0%, HER-2 positive ratio 2.08 T2 N0 stage IIb      05/20/2017 -  Chemotherapy    Taxol Herceptin weekly x12 followed by Herceptin maintenance every 3 weeks for 1 year (patient refused [place])        CHIEF COMPLIANT:  Follow-up on Herceptin maintenance INTERVAL HISTORY: Adriana Jones is a 62 year old with above-mentioned history of left breast cancer treated with mastectomy and adjuvant chemotherapy with Taxol Herceptin.  She is currently on Herceptin maintenance appears to be tolerating it extremely well.  She is here today to receive Herceptin but also does discuss antiestrogen therapy options.  REVIEW OF SYSTEMS:   Constitutional: Denies fevers, chills or abnormal weight loss Eyes: Denies blurriness of vision Ears, nose, mouth, throat, and face: Denies mucositis or sore throat Respiratory: Denies cough, dyspnea or wheezes Cardiovascular: Denies palpitation, chest discomfort Gastrointestinal:  Denies nausea, heartburn or change in bowel habits Skin: Denies abnormal skin  rashes Lymphatics: Denies new lymphadenopathy or easy bruising Neurological:Denies numbness, tingling or new weaknesses Behavioral/Psych: Mood is stable, no new changes  Extremities: No lower extremity edema All other systems were reviewed with the patient and are negative.  I have reviewed the past medical history, past surgical history, social history and family history with the patient and they are unchanged from previous note.  ALLERGIES:  is allergic to amoxicillin and sulfa antibiotics.  MEDICATIONS:  Current Outpatient Medications  Medication Sig Dispense Refill  . BIOTIN PO Take by mouth daily.    . Probiotic Product (PROBIOTIC PO) Take by mouth daily.    Marland Kitchen UNABLE TO FIND 100 mg 2 (two) times daily. EMPOWER     No current facility-administered medications for this visit.     PHYSICAL EXAMINATION: ECOG PERFORMANCE STATUS: 1 - Symptomatic but completely ambulatory  Vitals:   10/28/17 0951  BP: (!) 192/80  Pulse: 65  Resp: 18  Temp: 97.9 F (36.6 C)  SpO2: 100%   Filed Weights   10/28/17 0951  Weight: 143 lb 9.6 oz (65.1 kg)    GENERAL:alert, no distress and comfortable SKIN: skin color, texture, turgor are normal, no rashes or significant lesions EYES: normal, Conjunctiva are pink and non-injected, sclera clear OROPHARYNX:no exudate, no erythema and lips, buccal mucosa, and tongue normal  NECK: supple, thyroid normal size, non-tender, without nodularity LYMPH:  no palpable lymphadenopathy in the cervical, axillary or inguinal LUNGS: clear to auscultation and percussion with normal breathing effort HEART: regular rate & rhythm and no murmurs and no lower extremity edema ABDOMEN:abdomen soft, non-tender and normal bowel sounds MUSCULOSKELETAL:no cyanosis  of digits and no clubbing  NEURO: alert & oriented x 3 with fluent speech, no focal motor/sensory deficits EXTREMITIES: No lower extremity edema  LABORATORY DATA:  I have reviewed the data as listed CMP Latest  Ref Rng & Units 10/07/2017 09/16/2017 08/26/2017  Glucose 70 - 140 mg/dL 95 94 99  BUN 7 - 26 mg/dL '23 13 17  '$ Creatinine 0.60 - 1.10 mg/dL 0.71 0.67 0.67  Sodium 136 - 145 mmol/L 138 142 140  Potassium 3.5 - 5.1 mmol/L 4.2 4.1 4.2  Chloride 98 - 109 mmol/L 109 109 108  CO2 22 - 29 mmol/L '22 25 25  '$ Calcium 8.4 - 10.4 mg/dL 9.3 9.4 9.2  Total Protein 6.4 - 8.3 g/dL 7.2 7.1 6.7  Total Bilirubin 0.2 - 1.2 mg/dL 0.4 0.4 0.4  Alkaline Phos 40 - 150 U/L 82 83 81  AST 5 - 34 U/L '21 20 18  '$ ALT 0 - 55 U/L '22 20 21    '$ Lab Results  Component Value Date   WBC 4.8 10/28/2017   HGB 12.6 10/28/2017   HCT 37.6 10/28/2017   MCV 91.0 10/28/2017   PLT 218 10/28/2017   NEUTROABS 2.6 10/28/2017    ASSESSMENT & PLAN:  Malignant neoplasm of lower-inner quadrant of left breast in female, estrogen receptor positive ([place]) 04/04/2017:Left mastectomy: IDC grade 3, 3.8 cm, intermediate grade DCIS, invasive cancer comes 0.1 cm to posterior margin and less than 0.1 cm to anterior inferior margin at 0/13 lymph nodes, ER 100%, PR 0%, HER-2 positive ratio 2.08 T2 N0 stage IIb  Treatment plan: 1.Adjuvant chemotherapywithTHfollowed by Herceptin maintenance for 1 year. 2. Adjuvant antiestrogen therapy --------------------------------------------------------- Current treatment: Herceptinto be completed December 2019 ECHO11/09/2016: EF 60-65% ECHO 08/03/2017: EF 60-65% Patient taking probiotics. Herceptin toxicities: None  Antiestrogen therapy counseling: I discussed with her that she would benefit from antiestrogen therapy based on her tumor being estrogen receptor positive. I recommended that she start letrozole 2.5 mg daily. She is very concerned about side effects of letrozole.  She will read about this medicine and then inform me when she comes back to see me in 6 weeks.  Insomnia: This is her biggest problem. Return to clinic every 3 weeks for Herceptin every 6 weeks for follow-up with [place]   No orders  of the defined types were placed in this encounter.  The patient has a good understanding of the overall plan. she agrees with it. she will call with any problems that may develop before the next visit here.   Harriette Ohara, MD 10/28/17

## 2017-10-28 NOTE — Patient Instructions (Signed)
[facility] Discharge Instructions for Patients Receiving Chemotherapy  Today you received the following chemotherapy agents trastuzumab (Herceptin)  To help prevent nausea and vomiting after your treatment, we encourage you to take your nausea medication as directed  If you develop nausea and vomiting that is not controlled by your nausea medication, call the clinic.   BELOW ARE SYMPTOMS THAT SHOULD BE REPORTED IMMEDIATELY:  *FEVER GREATER THAN 100.5 F  *CHILLS WITH OR WITHOUT FEVER  NAUSEA AND VOMITING THAT IS NOT CONTROLLED WITH YOUR NAUSEA MEDICATION  *UNUSUAL SHORTNESS OF BREATH  *UNUSUAL BRUISING OR BLEEDING  TENDERNESS IN MOUTH AND THROAT WITH OR WITHOUT PRESENCE OF ULCERS  *URINARY PROBLEMS  *BOWEL PROBLEMS  UNUSUAL RASH Items with * indicate a potential emergency and should be followed up as soon as possible.  Feel free to call the clinic should you have any questions or concerns. The clinic phone number is [phone].  Please show the CHEMO ALERT CARD at check-in to the Emergency Department and triage nurse.   

## 2017-11-09 ENCOUNTER — Encounter (HOSPITAL_COMMUNITY): Payer: BLUE CROSS/BLUE SHIELD | Admitting: Internal Medicine

## 2017-11-09 ENCOUNTER — Other Ambulatory Visit (HOSPITAL_COMMUNITY): Payer: BLUE CROSS/BLUE SHIELD

## 2017-11-10 ENCOUNTER — Other Ambulatory Visit (HOSPITAL_COMMUNITY): Payer: BLUE CROSS/BLUE SHIELD

## 2017-11-17 ENCOUNTER — Ambulatory Visit (HOSPITAL_BASED_OUTPATIENT_CLINIC_OR_DEPARTMENT_OTHER)
Admission: RE | Admit: 2017-11-17 | Discharge: 2017-11-17 | Disposition: A | Discharge status: Home / Self Care | Payer: BLUE CROSS/BLUE SHIELD | Source: Ambulatory Visit | Attending: Internal Medicine | Admitting: Internal Medicine

## 2017-11-17 ENCOUNTER — Ambulatory Visit (HOSPITAL_COMMUNITY)
Admission: RE | Admit: 2017-11-17 | Discharge: 2017-11-17 | Disposition: A | Discharge status: Home / Self Care | Payer: BLUE CROSS/BLUE SHIELD | Source: Ambulatory Visit | Attending: Internal Medicine | Admitting: Internal Medicine

## 2017-11-17 VITALS — BP 174/87 | HR 72 | Wt 140.1 lb

## 2017-11-17 DIAGNOSIS — F419 Anxiety disorder, unspecified: Secondary | ICD-10-CM | POA: Diagnosis not present

## 2017-11-17 DIAGNOSIS — I10 Essential (primary) hypertension: Secondary | ICD-10-CM

## 2017-11-17 DIAGNOSIS — Z170 Estrogen receptor positive status [ER+]: Secondary | ICD-10-CM | POA: Diagnosis not present

## 2017-11-17 DIAGNOSIS — C50312 Malignant neoplasm of lower-inner quadrant of left female breast: Secondary | ICD-10-CM

## 2017-11-17 DIAGNOSIS — Z882 Allergy status to sulfonamides status: Secondary | ICD-10-CM | POA: Diagnosis not present

## 2017-11-17 DIAGNOSIS — Z880 Allergy status to penicillin: Secondary | ICD-10-CM | POA: Insufficient documentation

## 2017-11-17 DIAGNOSIS — Z87891 Personal history of nicotine dependence: Secondary | ICD-10-CM | POA: Diagnosis not present

## 2017-11-17 NOTE — Progress Notes (Signed)
Cardio-Oncology Clinic Note   Referring Physician: Dr. Lindi Adie Primary Cardiologist: New (Dr. Haroldine Laws)  HPI: Adriana Jones is a 62 y.o. female with past medical history of Left Breast CA who is referred by Dr. Lindi Adie  to establish in the cardio-oncology clinic for monitoring of cardio-toxicty while undergoing chemotherapy.  Cancer Profile  Malignant neoplasm of lower-inner quadrant of left breast in female, estrogen receptor positive ([place]) 02/28/2017:Palpable left breast masses 2.5 cm at 7:00 and another 2.5 cm at 6:00 1.9 cm apart biopsy: IDC grade 2 with DCIS ER 100%, PR 0%, Ki-67 20%, HER-2 positiveratio 2.08, axilla negative; in addition indeterminate calcifications medially and laterally,T2 N0 stage 2A clinical stage AJCC 8     Malignant neoplasm of lower-inner quadrant of left breast in female, estrogen receptor positive ([place])   02/28/2017 Initial Diagnosis    Palpable left breast masses 2.5 cm at 7:00 and another 2.5 cm at 6:00 1.9 cm apart biopsy: IDC grade 2 with DCISER 100%, PR 0%, Ki-67 20%, HER-2 positiveratio 2.08, axilla negative; in addition indeterminate calcifications medially and laterally,T2 N0 stage 2A clinical stage AJCC 8      04/04/2017 Surgery    Left mastectomy: IDC grade 3, 3.8 cm, intermediate grade DCIS, invasive cancer comes 0.1 cm to posterior margin and less than 0.1 cm to anterior inferior margin at 0/13 lymph nodes, ER 100%, PR 0%, HER-2 positive ratio 2.08 T2 N0 stage IIb      05/20/2017 -  Chemotherapy    Taxol Herceptin weekly x12 followed by Herceptin maintenance every 3 weeks for 1 year (patient refused [facility])        Doing well. Remains on Herceptin alone every 3 weeks. No problems tolerating. Walking dogs. No SOB, edema, orthopnea or PND.   No family history of coronary disease.   Echo 11/17/17 EF 60-65% GLS -21.5% Personally reviewed Echo 3/19  EF 60-65% GLS -22.2 LS 9.1  Echo 11/18 - EF 60-65% GLS -22.9 Lateral S'10.6     Past Medical History:  Diagnosis Date  . Anxiety   . Cancer [facility])    breast  . Headache     Current Outpatient Medications  Medication Sig Dispense Refill  . UNABLE TO FIND 100 mg 2 (two) times daily. EMPOWER     No current facility-administered medications for this encounter.     Allergies  Allergen Reactions  . Amoxicillin Hives    Has patient had a PCN reaction causing immediate rash, facial/tongue/throat swelling, SOB or lightheadedness with hypotension: No Has patient had a PCN reaction causing severe rash involving mucus membranes or skin necrosis: No Has patient had a PCN reaction that required hospitalization: No Has patient had a PCN reaction occurring within the last 10 years: Yes If all of the above answers are "NO", then may proceed with Cephalosporin use.   . Sulfa Antibiotics Hives      Social History   Socioeconomic History  . Marital status: Married    Spouse name: Not on file  . Number of children: Not on file  . Years of education: Not on file  . Highest education level: Not on file  Occupational History  . Not on file  Social Needs  . Financial resource strain: Not on file  . Food insecurity:    Worry: Not on file    Inability: Not on file  . Transportation needs:    Medical: Not on file    Non-medical: Not on file  Tobacco Use  . Smoking status: Former Smoker  Years: 3.00    Types: E-cigarettes    Start date: 08/07/2013  . Smokeless tobacco: Never Used  Substance and Sexual Activity  . Alcohol use: Yes    Comment: 4-10/wk  . Drug use: No  . Sexual activity: Not on file  Lifestyle  . Physical activity:    Days per week: Not on file    Minutes per session: Not on file  . Stress: Not on file  Relationships  . Social connections:    Talks on phone: Not on file    Gets together: Not on file    Attends religious service: Not on file    Active member of club or organization: Not on file    Attends meetings of clubs or  organizations: Not on file    Relationship status: Not on file  . Intimate partner violence:    Fear of current or ex partner: Not on file    Emotionally abused: Not on file    Physically abused: Not on file    Forced sexual activity: Not on file  Other Topics Concern  . Not on file  Social History Narrative  . Not on file   FHx: No family history of coronary disease.   Vitals:   11/17/17 1209  BP: (!) 174/87  Pulse: 72  SpO2: 100%  Weight: 140 lb 1.9 oz (63.6 kg)     PHYSICAL EXAM: General:  Well appearing. No resp difficulty HEENT: normal Neck: supple. no JVD. Carotids 2+ bilat; no bruits. No lymphadenopathy or thryomegaly appreciated. Cor: PMI nondisplaced. Regular rate & rhythm. No rubs, gallops or murmurs. R port a cath Lungs: clear Abdomen: soft, nontender, nondistended. No hepatosplenomegaly. No bruits or masses. Good bowel sounds. Extremities: no cyanosis, clubbing, rash, edema Neuro: alert & orientedx3, cranial nerves grossly intact. moves all 4 extremities w/o difficulty. Affect pleasant   ASSESSMENT & PLAN:  1. Breast CA of Lower-Inner quadrant of left breast in female, Estrogen Receptor + - I reviewed echos personally. EF and Doppler parameters stable. No HF on exam. Continue Herceptin.  2. HTN - BP remains elevated in the office and at the [facility]. However BPs at home seem well controlled. Says SBP 140 in the morning and 115-120 in the afternoon.  Me be a component of white-coat HTN.  - will check ambulatory BP monitor to further assess  Glori Bickers, MD 11/17/17 12:23 PM

## 2017-11-17 NOTE — Patient Instructions (Signed)
Echocardiogram and Follow up in 3 months.  Please call our office at [phone], Option 5 when you're ready for the 48 hour BP cuff.

## 2017-11-17 NOTE — Addendum Note (Signed)
Encounter addended by: Darron Doom, RN on: 11/17/2017 12:35 PM  Actions taken: Order list changed, Diagnosis association updated, Sign clinical note

## 2017-11-17 NOTE — Progress Notes (Signed)
  Echocardiogram 2D Echocardiogram has been performed.  Jannett Celestine 11/17/2017, 12:03 PM

## 2017-11-18 ENCOUNTER — Other Ambulatory Visit (HOSPITAL_COMMUNITY): Payer: Self-pay | Admitting: *Deleted

## 2017-11-18 ENCOUNTER — Inpatient Hospital Stay: Payer: BLUE CROSS/BLUE SHIELD

## 2017-11-18 VITALS — BP 147/82 | HR 65 | Resp 18

## 2017-11-18 DIAGNOSIS — Z5112 Encounter for antineoplastic immunotherapy: Secondary | ICD-10-CM | POA: Diagnosis not present

## 2017-11-18 DIAGNOSIS — Z170 Estrogen receptor positive status [ER+]: Principal | ICD-10-CM

## 2017-11-18 DIAGNOSIS — I10 Essential (primary) hypertension: Secondary | ICD-10-CM

## 2017-11-18 DIAGNOSIS — Z95828 Presence of other vascular implants and grafts: Secondary | ICD-10-CM

## 2017-11-18 DIAGNOSIS — C50312 Malignant neoplasm of lower-inner quadrant of left female breast: Secondary | ICD-10-CM

## 2017-11-18 LAB — CBC WITH DIFFERENTIAL/PLATELET
Basophils Absolute: 0 10*3/uL (ref 0.0–0.1)
Basophils Relative: 1 %
EOS ABS: 0.1 10*3/uL (ref 0.0–0.5)
Eosinophils Relative: 2 %
HEMATOCRIT: 39.2 % (ref 34.8–46.6)
HEMOGLOBIN: 12.8 g/dL (ref 11.6–15.9)
LYMPHS PCT: 30 %
Lymphs Abs: 1.3 10*3/uL (ref 0.9–3.3)
MCH: 29.9 pg (ref 25.1–34.0)
MCHC: 32.7 g/dL (ref 31.5–36.0)
MCV: 91.6 fL (ref 79.5–101.0)
MONOS PCT: 11 %
Monocytes Absolute: 0.5 10*3/uL (ref 0.1–0.9)
NEUTROS PCT: 56 %
Neutro Abs: 2.5 10*3/uL (ref 1.5–6.5)
Platelets: 255 10*3/uL (ref 145–400)
RBC: 4.28 MIL/uL (ref 3.70–5.45)
RDW: 13.3 % (ref 11.2–14.5)
WBC: 4.4 10*3/uL (ref 3.9–10.3)

## 2017-11-18 LAB — COMPREHENSIVE METABOLIC PANEL
ALT: 19 U/L (ref 0–44)
AST: 20 U/L (ref 15–41)
Albumin: 4.1 g/dL (ref 3.5–5.0)
Alkaline Phosphatase: 78 U/L (ref 38–126)
Anion gap: 7 (ref 5–15)
BUN: 21 mg/dL — ABNORMAL HIGH (ref 6–20)
CHLORIDE: 109 mmol/L (ref 98–111)
CO2: 26 mmol/L (ref 22–32)
CREATININE: 0.64 mg/dL (ref 0.44–1.00)
Calcium: 9.1 mg/dL (ref 8.9–10.3)
GFR calc non Af Amer: >60 mL/min (ref >60)
Glucose, Bld: 103 mg/dL — ABNORMAL HIGH (ref 70–99)
Potassium: 4.2 mmol/L (ref 3.5–5.1)
SODIUM: 142 mmol/L (ref 135–145)
Total Bilirubin: 0.7 mg/dL (ref 0.3–1.2)
Total Protein: 7.5 g/dL (ref 6.5–8.1)

## 2017-11-18 MED ORDER — TRASTUZUMAB CHEMO 150 MG IV SOLR
6.0000 mg/kg | Freq: Once | INTRAVENOUS | Status: AC
  Administered 2017-11-18: 378 mg via INTRAVENOUS
  Filled 2017-11-18: qty 18

## 2017-11-18 MED ORDER — ACETAMINOPHEN 325 MG PO TABS
650.0000 mg | ORAL_TABLET | Freq: Once | ORAL | Status: AC
  Administered 2017-11-18: 650 mg via ORAL

## 2017-11-18 MED ORDER — SODIUM CHLORIDE 0.9% FLUSH
10.0000 mL | INTRAVENOUS | Status: DC | PRN
  Administered 2017-11-18: 10 mL via INTRAVENOUS
  Filled 2017-11-18: qty 10

## 2017-11-18 MED ORDER — SODIUM CHLORIDE 0.9 % IV SOLN
Freq: Once | INTRAVENOUS | Status: AC
  Administered 2017-11-18: 11:00:00 via INTRAVENOUS

## 2017-11-18 MED ORDER — SODIUM CHLORIDE 0.9% FLUSH
10.0000 mL | INTRAVENOUS | Status: DC | PRN
  Administered 2017-11-18: 10 mL
  Filled 2017-11-18: qty 10

## 2017-11-18 MED ORDER — ACETAMINOPHEN 325 MG PO TABS
ORAL_TABLET | ORAL | Status: AC
Start: 2017-11-18 — End: ?
  Filled 2017-11-18: qty 2

## 2017-11-18 MED ORDER — HEPARIN SOD (PORK) LOCK FLUSH 100 UNIT/ML IV SOLN
500.0000 [IU] | Freq: Once | INTRAVENOUS | Status: AC | PRN
  Administered 2017-11-18: 500 [IU]
  Filled 2017-11-18: qty 5

## 2017-11-18 NOTE — Patient Instructions (Signed)
[facility] Discharge Instructions for Patients Receiving Chemotherapy  Today you received the following chemotherapy agents Herceptin  To help prevent nausea and vomiting after your treatment, we encourage you to take your nausea medication as directed   If you develop nausea and vomiting that is not controlled by your nausea medication, call the clinic.   BELOW ARE SYMPTOMS THAT SHOULD BE REPORTED IMMEDIATELY:  *FEVER GREATER THAN 100.5 F  *CHILLS WITH OR WITHOUT FEVER  NAUSEA AND VOMITING THAT IS NOT CONTROLLED WITH YOUR NAUSEA MEDICATION  *UNUSUAL SHORTNESS OF BREATH  *UNUSUAL BRUISING OR BLEEDING  TENDERNESS IN MOUTH AND THROAT WITH OR WITHOUT PRESENCE OF ULCERS  *URINARY PROBLEMS  *BOWEL PROBLEMS  UNUSUAL RASH Items with * indicate a potential emergency and should be followed up as soon as possible.  Feel free to call the clinic should you have any questions or concerns. The clinic phone number is [phone].  Please show the CHEMO ALERT CARD at check-in to the Emergency Department and triage nurse.   

## 2017-11-18 NOTE — Progress Notes (Signed)
Pt declined Benadryl today.  Order discontinued.  Hardie Pulley, PharmD, BCPS, BCOP

## 2017-12-09 ENCOUNTER — Inpatient Hospital Stay: Payer: BLUE CROSS/BLUE SHIELD | Attending: Hematology and Oncology

## 2017-12-09 ENCOUNTER — Inpatient Hospital Stay: Payer: BLUE CROSS/BLUE SHIELD

## 2017-12-09 ENCOUNTER — Telehealth: Payer: Self-pay | Admitting: Adult Health

## 2017-12-09 ENCOUNTER — Inpatient Hospital Stay (HOSPITAL_BASED_OUTPATIENT_CLINIC_OR_DEPARTMENT_OTHER): Payer: BLUE CROSS/BLUE SHIELD | Admitting: Adult Health

## 2017-12-09 ENCOUNTER — Encounter: Payer: Self-pay | Admitting: Adult Health

## 2017-12-09 VITALS — BP 181/81 | HR 66 | Temp 98.0°F | Resp 18 | Ht 64.0 in | Wt 141.9 lb

## 2017-12-09 DIAGNOSIS — Z5112 Encounter for antineoplastic immunotherapy: Secondary | ICD-10-CM | POA: Insufficient documentation

## 2017-12-09 DIAGNOSIS — Z95828 Presence of other vascular implants and grafts: Secondary | ICD-10-CM

## 2017-12-09 DIAGNOSIS — Z9012 Acquired absence of left breast and nipple: Secondary | ICD-10-CM | POA: Insufficient documentation

## 2017-12-09 DIAGNOSIS — Z170 Estrogen receptor positive status [ER+]: Secondary | ICD-10-CM | POA: Insufficient documentation

## 2017-12-09 DIAGNOSIS — F419 Anxiety disorder, unspecified: Secondary | ICD-10-CM | POA: Diagnosis not present

## 2017-12-09 DIAGNOSIS — Z87891 Personal history of nicotine dependence: Secondary | ICD-10-CM | POA: Insufficient documentation

## 2017-12-09 DIAGNOSIS — C50312 Malignant neoplasm of lower-inner quadrant of left female breast: Secondary | ICD-10-CM

## 2017-12-09 LAB — CBC WITH DIFFERENTIAL (CANCER CENTER ONLY)
Basophils Absolute: 0.1 10*3/uL (ref 0.0–0.1)
Basophils Relative: 1 %
Eosinophils Absolute: 0.1 10*3/uL (ref 0.0–0.5)
Eosinophils Relative: 2 %
HEMATOCRIT: 37.4 % (ref 34.8–46.6)
Hemoglobin: 12.5 g/dL (ref 11.6–15.9)
LYMPHS ABS: 1.2 10*3/uL (ref 0.9–3.3)
LYMPHS PCT: 24 %
MCH: 29.6 pg (ref 25.1–34.0)
MCHC: 33.3 g/dL (ref 31.5–36.0)
MCV: 88.9 fL (ref 79.5–101.0)
MONOS PCT: 10 %
Monocytes Absolute: 0.5 10*3/uL (ref 0.1–0.9)
NEUTROS ABS: 3.1 10*3/uL (ref 1.5–6.5)
NEUTROS PCT: 63 %
Platelet Count: 227 10*3/uL (ref 145–400)
RBC: 4.21 MIL/uL (ref 3.70–5.45)
RDW: 13.5 % (ref 11.2–14.5)
WBC Count: 4.8 10*3/uL (ref 3.9–10.3)

## 2017-12-09 LAB — CMP (CANCER CENTER ONLY)
ALT: 17 U/L (ref 0–44)
ANION GAP: 7 (ref 5–15)
AST: 17 U/L (ref 15–41)
Albumin: 3.9 g/dL (ref 3.5–5.0)
Alkaline Phosphatase: 87 U/L (ref 38–126)
BUN: 16 mg/dL (ref 6–20)
CHLORIDE: 109 mmol/L (ref 98–111)
CO2: 25 mmol/L (ref 22–32)
CREATININE: 0.71 mg/dL (ref 0.44–1.00)
Calcium: 9.1 mg/dL (ref 8.9–10.3)
GFR, Estimated: >60 mL/min (ref >60)
Glucose, Bld: 90 mg/dL (ref 70–99)
POTASSIUM: 4 mmol/L (ref 3.5–5.1)
SODIUM: 141 mmol/L (ref 135–145)
Total Bilirubin: 0.3 mg/dL (ref 0.3–1.2)
Total Protein: 6.9 g/dL (ref 6.5–8.1)

## 2017-12-09 MED ORDER — HEPARIN SOD (PORK) LOCK FLUSH 100 UNIT/ML IV SOLN
500.0000 [IU] | Freq: Once | INTRAVENOUS | Status: AC | PRN
  Administered 2017-12-09: 500 [IU]
  Filled 2017-12-09: qty 5

## 2017-12-09 MED ORDER — SODIUM CHLORIDE 0.9 % IV SOLN
Freq: Once | INTRAVENOUS | Status: AC
  Administered 2017-12-09: 10:00:00 via INTRAVENOUS

## 2017-12-09 MED ORDER — ACETAMINOPHEN 325 MG PO TABS
650.0000 mg | ORAL_TABLET | Freq: Once | ORAL | Status: AC
  Administered 2017-12-09: 650 mg via ORAL

## 2017-12-09 MED ORDER — SODIUM CHLORIDE 0.9% FLUSH
10.0000 mL | INTRAVENOUS | Status: DC | PRN
  Administered 2017-12-09: 10 mL
  Filled 2017-12-09: qty 10

## 2017-12-09 MED ORDER — TRASTUZUMAB CHEMO 150 MG IV SOLR
6.0000 mg/kg | Freq: Once | INTRAVENOUS | Status: AC
  Administered 2017-12-09: 378 mg via INTRAVENOUS
  Filled 2017-12-09: qty 18

## 2017-12-09 MED ORDER — ACETAMINOPHEN 325 MG PO TABS
ORAL_TABLET | ORAL | Status: AC
  Filled 2017-12-09: qty 2

## 2017-12-09 MED ORDER — SODIUM CHLORIDE 0.9% FLUSH
10.0000 mL | INTRAVENOUS | Status: DC | PRN
  Administered 2017-12-09: 10 mL via INTRAVENOUS
  Filled 2017-12-09: qty 10

## 2017-12-09 NOTE — Progress Notes (Signed)
[facility] Follow up:    Molli Posey, MD [address] [place] [address] [place] [address]   DIAGNOSIS: Cancer Staging Malignant neoplasm of lower-inner quadrant of left breast in female, estrogen receptor positive ([place]) Staging form: Breast, AJCC 8th Edition - Clinical stage from 03/09/2017: Stage IIA (cT2, cN0, cM0, G2, ER: Positive, PR: Negative, HER2: Positive) - Unsigned - Pathologic: Stage IIA (pT2, pN0, cM0, G3, ER: Positive, PR: Negative, HER2: Positive) - Unsigned   SUMMARY OF ONCOLOGIC HISTORY:   Malignant neoplasm of lower-inner quadrant of left breast in female, estrogen receptor positive ([place])   02/28/2017 Initial Diagnosis    Palpable left breast masses 2.5 cm at 7:00 and another 2.5 cm at 6:00 1.9 cm apart biopsy: IDC grade 2 with DCISER 100%, PR 0%, Ki-67 20%, HER-2 positiveratio 2.08, axilla negative; in addition indeterminate calcifications medially and laterally,T2 N0 stage 2A clinical stage AJCC 8      04/04/2017 Surgery    Left mastectomy: IDC grade 3, 3.8 cm, intermediate grade DCIS, invasive cancer comes 0.1 cm to posterior margin and less than 0.1 cm to anterior inferior margin at 0/13 lymph nodes, ER 100%, PR 0%, HER-2 positive ratio 2.08 T2 N0 stage IIb      05/20/2017 -  Chemotherapy    Taxol Herceptin weekly x12 followed by Herceptin maintenance every 3 weeks for 1 year (patient refused [facility])        CURRENT THERAPY: Herceptin  INTERVAL HISTORY: Fannie Knee 62 y.o. female returns for evaluation prior to receiving Herceptin.  She is doing moderately well.  She did see Dr. Haroldine Laws last month, her bp has continued to be elevated here and at his office, however it is normal at her home.  She discussed anti estrogen therapies with Dr. Lindi Adie at her last appointment.  She has not yet decided to take it.  She is doing her research on it.    Patient Active Problem List   Diagnosis Date Noted  . Port-A-Cath in place 06/03/2017  .  Malignant neoplasm of lower-inner quadrant of left breast in female, estrogen receptor positive ([place]) 03/03/2017    is allergic to amoxicillin and sulfa antibiotics.  MEDICAL HISTORY: Past Medical History:  Diagnosis Date  . Anxiety   . Cancer [facility])    breast  . Headache     SURGICAL HISTORY: Past Surgical History:  Procedure Laterality Date  . BREAST EXCISIONAL BIOPSY    . PORTACATH PLACEMENT N/A 04/04/2017   Procedure: INSERTION PORT-A-CATH;  Surgeon: Jovita Kussmaul, MD;  Location: [facility];  Service: General;  Laterality: N/A;  . SIMPLE MASTECTOMY WITH AXILLARY SENTINEL NODE BIOPSY Left 04/04/2017   Procedure: LEFT MASTECTOMY WITH LEFT SENTINEL LYMPH NODE BIOPSY;  Surgeon: Jovita Kussmaul, MD;  Location: [facility];  Service: General;  Laterality: Left;    SOCIAL HISTORY: Social History   Socioeconomic History  . Marital status: Married    Spouse name: Not on file  . Number of children: Not on file  . Years of education: Not on file  . Highest education level: Not on file  Occupational History  . Not on file  Social Needs  . Financial resource strain: Not on file  . Food insecurity:    Worry: Not on file    Inability: Not on file  . Transportation needs:    Medical: Not on file    Non-medical: Not on file  Tobacco Use  . Smoking status: Former Smoker  Years: 3.00    Types: E-cigarettes    Start date: 08/07/2013  . Smokeless tobacco: Never Used  Substance and Sexual Activity  . Alcohol use: Yes    Comment: 4-10/wk  . Drug use: No  . Sexual activity: Not on file  Lifestyle  . Physical activity:    Days per week: Not on file    Minutes per session: Not on file  . Stress: Not on file  Relationships  . Social connections:    Talks on phone: Not on file    Gets together: Not on file    Attends religious service: Not on file    Active member of club or organization: Not on file    Attends meetings of clubs or organizations: Not on file    Relationship status: Not  on file  . Intimate partner violence:    Fear of current or ex partner: Not on file    Emotionally abused: Not on file    Physically abused: Not on file    Forced sexual activity: Not on file  Other Topics Concern  . Not on file  Social History Narrative  . Not on file    FAMILY HISTORY: History reviewed. No pertinent family history.  Review of Systems  Constitutional: Negative for appetite change, chills, fatigue, fever and unexpected weight change.  HENT:   Negative for hearing loss, lump/mass, sore throat and voice change.   Eyes: Negative for eye problems and icterus.  Cardiovascular: Negative for chest pain, leg swelling and palpitations.  Gastrointestinal: Negative for abdominal distention, abdominal pain, constipation, diarrhea, nausea and vomiting.  Endocrine: Negative for hot flashes.  Musculoskeletal: Negative for arthralgias.  Skin: Negative for itching and rash.  Neurological: Negative for dizziness, extremity weakness, headaches and numbness.  Hematological: Negative for adenopathy. Does not bruise/bleed easily.  Psychiatric/Behavioral: Negative for depression. The patient is not nervous/anxious.       PHYSICAL EXAMINATION  ECOG PERFORMANCE STATUS: 0 - Asymptomatic  Vitals:   12/09/17 0919  BP: (!) 181/81  Pulse: 66  Resp: 18  Temp: 98 F (36.7 C)  SpO2: 100%    Physical Exam  Constitutional: She is oriented to person, place, and time. She appears well-developed and well-nourished.  HENT:  Head: Normocephalic and atraumatic.  Mouth/Throat: Oropharynx is clear and moist. No oropharyngeal exudate.  Eyes: Pupils are equal, round, and reactive to light. No scleral icterus.  Neck: Neck supple.  Cardiovascular: Normal rate, regular rhythm, normal heart sounds and intact distal pulses.  Pulmonary/Chest: Effort normal and breath sounds normal.  Abdominal: Soft. Bowel sounds are normal. She exhibits no distension and no mass. There is no tenderness. There is  no guarding.  Musculoskeletal: Normal range of motion. She exhibits no edema.  Neurological: She is alert and oriented to person, place, and time.  Skin: Skin is warm and dry. Capillary refill takes less than 2 seconds. No rash noted.  Psychiatric: She has a normal mood and affect.    LABORATORY DATA:  CBC    Component Value Date/Time   WBC 4.4 11/18/2017 1024   RBC 4.28 11/18/2017 1024   HGB 12.8 11/18/2017 1024   HGB 12.3 05/27/2017 1130   HCT 39.2 11/18/2017 1024   HCT 37.1 05/27/2017 1130   PLT 255 11/18/2017 1024   PLT 229 05/27/2017 1130   MCV 91.6 11/18/2017 1024   MCV 90.9 05/27/2017 1130   MCH 29.9 11/18/2017 1024   MCHC 32.7 11/18/2017 1024   RDW 13.3 11/18/2017 1024  RDW 13.5 05/27/2017 1130   LYMPHSABS 1.3 11/18/2017 1024   LYMPHSABS 0.9 05/27/2017 1130   MONOABS 0.5 11/18/2017 1024   MONOABS 0.4 05/27/2017 1130   EOSABS 0.1 11/18/2017 1024   EOSABS 0.0 05/27/2017 1130   BASOSABS 0.0 11/18/2017 1024   BASOSABS 0.0 05/27/2017 1130    CMP     Component Value Date/Time   NA 142 11/18/2017 1024   NA 138 05/27/2017 1130   K 4.2 11/18/2017 1024   K 4.3 05/27/2017 1130   CL 109 11/18/2017 1024   CO2 26 11/18/2017 1024   CO2 24 05/27/2017 1130   GLUCOSE 103 (H) 11/18/2017 1024   GLUCOSE 105 05/27/2017 1130   BUN 21 (H) 11/18/2017 1024   BUN 11.3 05/27/2017 1130   CREATININE 0.64 11/18/2017 1024   CREATININE 0.7 05/27/2017 1130   CALCIUM 9.1 11/18/2017 1024   CALCIUM 9.1 05/27/2017 1130   PROT 7.5 11/18/2017 1024   PROT 7.0 05/27/2017 1130   ALBUMIN 4.1 11/18/2017 1024   ALBUMIN 3.9 05/27/2017 1130   AST 20 11/18/2017 1024   AST 22 05/27/2017 1130   ALT 19 11/18/2017 1024   ALT 40 05/27/2017 1130   ALKPHOS 78 11/18/2017 1024   ALKPHOS 95 05/27/2017 1130   BILITOT 0.7 11/18/2017 1024   BILITOT 0.55 05/27/2017 1130   GFRNONAA >60 11/18/2017 1024   GFRAA >60 11/18/2017 1024      ASSESSMENT and PLAN:   Malignant neoplasm of lower-inner  quadrant of left breast in female, estrogen receptor positive ([place]) 04/04/2017:Left mastectomy: IDC grade 3, 3.8 cm, intermediate grade DCIS, invasive cancer comes 0.1 cm to posterior margin and less than 0.1 cm to anterior inferior margin at 0/13 lymph nodes, ER 100%, PR 0%, HER-2 positive ratio 2.08 T2 N0 stage IIb  Treatment plan: 1.Adjuvant chemotherapywithTHfollowed by Herceptin maintenance for 1 year. 2. Adjuvant antiestrogen therapy --------------------------------------------------------- Current treatment: Herceptinto be completed December 2019 ECHO11/09/2016: EF 60-65% ECHO 08/03/2017: EF 60-65% Echo 11/17/2017: EF 60-65% Patient taking probiotics. Herceptin toxicities: None  Dwanna is doing well today.  She is tolerating treatment without difficulty. She will continue to f/u with Dr. Haroldine Laws regarding her blood pressure and home monitoring with their machine.  She will wait until she sees Dr. Lindi Adie again to review her anti estrogen choices and his recommendation for dosing.    Lakeyta will return in 3 weeks for labs and Herceptin and in 6 weeks for labs, f/u with Dr. Lindi Adie, and Herceptin.     All questions were answered. The patient knows to call the clinic with any problems, questions or concerns. We can certainly see the patient much sooner if necessary.  A total of (30) minutes of face-to-face time was spent with this patient with greater than 50% of that time in counseling and care-coordination.  This note was electronically signed. Scot Dock, NP 12/09/2017

## 2017-12-09 NOTE — Addendum Note (Signed)
Addended by: Scot Dock on: 12/09/2017 10:37 AM   Modules accepted: Orders

## 2017-12-09 NOTE — Assessment & Plan Note (Addendum)
04/04/2017:Left mastectomy: IDC grade 3, 3.8 cm, intermediate grade DCIS, invasive cancer comes 0.1 cm to posterior margin and less than 0.1 cm to anterior inferior margin at 0/13 lymph nodes, ER 100%, PR 0%, HER-2 positive ratio 2.08 T2 N0 stage IIb  Treatment plan: 1.Adjuvant chemotherapywithTHfollowed by Herceptin maintenance for 1 year. 2. Adjuvant antiestrogen therapy --------------------------------------------------------- Current treatment: Herceptinto be completed December 2019 ECHO11/09/2016: EF 60-65% ECHO 08/03/2017: EF 60-65% Echo 11/17/2017: EF 60-65% Patient taking probiotics. Herceptin toxicities: None  Adriana Jones is doing well today.  She is tolerating treatment without difficulty. She will continue to f/u with Dr. Haroldine Laws regarding her blood pressure and home monitoring with their machine.  She will wait until she sees Dr. Lindi Adie again to review her anti estrogen choices and his recommendation for dosing.    Adriana Jones will return in 3 weeks for labs and Herceptin and in 6 weeks for labs, f/u with Dr. Lindi Adie, and Herceptin.

## 2017-12-09 NOTE — Telephone Encounter (Signed)
Gave avs and calendar ° °

## 2017-12-09 NOTE — Patient Instructions (Signed)
[facility] Discharge Instructions for Patients Receiving Chemotherapy  Today you received the following chemotherapy agents Herceptin  To help prevent nausea and vomiting after your treatment, we encourage you to take your nausea medication as directed   If you develop nausea and vomiting that is not controlled by your nausea medication, call the clinic.   BELOW ARE SYMPTOMS THAT SHOULD BE REPORTED IMMEDIATELY:  *FEVER GREATER THAN 100.5 F  *CHILLS WITH OR WITHOUT FEVER  NAUSEA AND VOMITING THAT IS NOT CONTROLLED WITH YOUR NAUSEA MEDICATION  *UNUSUAL SHORTNESS OF BREATH  *UNUSUAL BRUISING OR BLEEDING  TENDERNESS IN MOUTH AND THROAT WITH OR WITHOUT PRESENCE OF ULCERS  *URINARY PROBLEMS  *BOWEL PROBLEMS  UNUSUAL RASH Items with * indicate a potential emergency and should be followed up as soon as possible.  Feel free to call the clinic should you have any questions or concerns. The clinic phone number is [phone].  Please show the CHEMO ALERT CARD at check-in to the Emergency Department and triage nurse.   

## 2017-12-30 ENCOUNTER — Inpatient Hospital Stay: Payer: BLUE CROSS/BLUE SHIELD

## 2017-12-30 ENCOUNTER — Inpatient Hospital Stay: Payer: BLUE CROSS/BLUE SHIELD | Attending: Hematology and Oncology

## 2017-12-30 VITALS — BP 176/81 | HR 66 | Temp 98.1°F | Resp 18

## 2017-12-30 DIAGNOSIS — Z79899 Other long term (current) drug therapy: Secondary | ICD-10-CM | POA: Insufficient documentation

## 2017-12-30 DIAGNOSIS — C50312 Malignant neoplasm of lower-inner quadrant of left female breast: Secondary | ICD-10-CM

## 2017-12-30 DIAGNOSIS — Z170 Estrogen receptor positive status [ER+]: Secondary | ICD-10-CM | POA: Insufficient documentation

## 2017-12-30 DIAGNOSIS — Z9012 Acquired absence of left breast and nipple: Secondary | ICD-10-CM | POA: Insufficient documentation

## 2017-12-30 DIAGNOSIS — Z5112 Encounter for antineoplastic immunotherapy: Secondary | ICD-10-CM | POA: Insufficient documentation

## 2017-12-30 DIAGNOSIS — Z95828 Presence of other vascular implants and grafts: Secondary | ICD-10-CM

## 2017-12-30 LAB — CMP (CANCER CENTER ONLY)
ALBUMIN: 3.8 g/dL (ref 3.5–5.0)
ALK PHOS: 91 U/L (ref 38–126)
ALT: 16 U/L (ref 0–44)
AST: 15 U/L (ref 15–41)
Anion gap: 8 (ref 5–15)
BUN: 14 mg/dL (ref 6–20)
CALCIUM: 8.6 mg/dL — AB (ref 8.9–10.3)
CHLORIDE: 108 mmol/L (ref 98–111)
CO2: 24 mmol/L (ref 22–32)
CREATININE: 0.68 mg/dL (ref 0.44–1.00)
GFR, Est AFR Am: >60 mL/min (ref >60)
GFR, Estimated: >60 mL/min (ref >60)
GLUCOSE: 91 mg/dL (ref 70–99)
Potassium: 4 mmol/L (ref 3.5–5.1)
SODIUM: 140 mmol/L (ref 135–145)
Total Bilirubin: 0.5 mg/dL (ref 0.3–1.2)
Total Protein: 6.9 g/dL (ref 6.5–8.1)

## 2017-12-30 LAB — CBC WITH DIFFERENTIAL (CANCER CENTER ONLY)
Basophils Absolute: 0 10*3/uL (ref 0.0–0.1)
Basophils Relative: 1 %
Eosinophils Absolute: 0.1 10*3/uL (ref 0.0–0.5)
Eosinophils Relative: 2 %
HCT: 37.8 % (ref 34.8–46.6)
HEMOGLOBIN: 12.2 g/dL (ref 11.6–15.9)
LYMPHS ABS: 1.4 10*3/uL (ref 0.9–3.3)
LYMPHS PCT: 29 %
MCH: 29.3 pg (ref 25.1–34.0)
MCHC: 32.3 g/dL (ref 31.5–36.0)
MCV: 90.9 fL (ref 79.5–101.0)
Monocytes Absolute: 0.5 10*3/uL (ref 0.1–0.9)
Monocytes Relative: 10 %
NEUTROS PCT: 58 %
Neutro Abs: 2.9 10*3/uL (ref 1.5–6.5)
Platelet Count: 236 10*3/uL (ref 145–400)
RBC: 4.16 MIL/uL (ref 3.70–5.45)
RDW: 13.6 % (ref 11.2–14.5)
WBC: 5 10*3/uL (ref 3.9–10.3)

## 2017-12-30 MED ORDER — SODIUM CHLORIDE 0.9% FLUSH
10.0000 mL | INTRAVENOUS | Status: DC | PRN
  Administered 2017-12-30: 10 mL via INTRAVENOUS
  Filled 2017-12-30: qty 10

## 2017-12-30 MED ORDER — SODIUM CHLORIDE 0.9 % IV SOLN
Freq: Once | INTRAVENOUS | Status: AC
Start: 2017-12-30 — End: 2017-12-30
  Administered 2017-12-30: 11:00:00 via INTRAVENOUS
  Filled 2017-12-30: qty 250

## 2017-12-30 MED ORDER — TRASTUZUMAB CHEMO 150 MG IV SOLR
6.0000 mg/kg | Freq: Once | INTRAVENOUS | Status: AC
  Administered 2017-12-30: 378 mg via INTRAVENOUS
  Filled 2017-12-30: qty 18

## 2017-12-30 MED ORDER — HEPARIN SOD (PORK) LOCK FLUSH 100 UNIT/ML IV SOLN
500.0000 [IU] | Freq: Once | INTRAVENOUS | Status: AC | PRN
  Administered 2017-12-30: 500 [IU]
  Filled 2017-12-30: qty 5

## 2017-12-30 MED ORDER — ACETAMINOPHEN 325 MG PO TABS
ORAL_TABLET | ORAL | Status: AC
  Filled 2017-12-30: qty 2

## 2017-12-30 MED ORDER — SODIUM CHLORIDE 0.9% FLUSH
10.0000 mL | INTRAVENOUS | Status: DC | PRN
  Administered 2017-12-30: 10 mL
  Filled 2017-12-30: qty 10

## 2017-12-30 MED ORDER — ACETAMINOPHEN 325 MG PO TABS
650.0000 mg | ORAL_TABLET | Freq: Once | ORAL | Status: AC
  Administered 2017-12-30: 650 mg via ORAL

## 2017-12-30 NOTE — Patient Instructions (Signed)
[facility] Discharge Instructions for Patients Receiving Chemotherapy  Today you received the following chemotherapy agents Herceptin  To help prevent nausea and vomiting after your treatment, we encourage you to take your nausea medication as directed   If you develop nausea and vomiting that is not controlled by your nausea medication, call the clinic.   BELOW ARE SYMPTOMS THAT SHOULD BE REPORTED IMMEDIATELY:  *FEVER GREATER THAN 100.5 F  *CHILLS WITH OR WITHOUT FEVER  NAUSEA AND VOMITING THAT IS NOT CONTROLLED WITH YOUR NAUSEA MEDICATION  *UNUSUAL SHORTNESS OF BREATH  *UNUSUAL BRUISING OR BLEEDING  TENDERNESS IN MOUTH AND THROAT WITH OR WITHOUT PRESENCE OF ULCERS  *URINARY PROBLEMS  *BOWEL PROBLEMS  UNUSUAL RASH Items with * indicate a potential emergency and should be followed up as soon as possible.  Feel free to call the clinic should you have any questions or concerns. The clinic phone number is [phone].  Please show the CHEMO ALERT CARD at check-in to the Emergency Department and triage nurse.   

## 2018-01-20 ENCOUNTER — Inpatient Hospital Stay: Payer: BLUE CROSS/BLUE SHIELD

## 2018-01-20 ENCOUNTER — Inpatient Hospital Stay (HOSPITAL_BASED_OUTPATIENT_CLINIC_OR_DEPARTMENT_OTHER): Payer: BLUE CROSS/BLUE SHIELD | Admitting: Hematology and Oncology

## 2018-01-20 DIAGNOSIS — Z170 Estrogen receptor positive status [ER+]: Secondary | ICD-10-CM | POA: Diagnosis not present

## 2018-01-20 DIAGNOSIS — Z9012 Acquired absence of left breast and nipple: Secondary | ICD-10-CM | POA: Diagnosis not present

## 2018-01-20 DIAGNOSIS — Z79811 Long term (current) use of aromatase inhibitors: Secondary | ICD-10-CM

## 2018-01-20 DIAGNOSIS — C50312 Malignant neoplasm of lower-inner quadrant of left female breast: Secondary | ICD-10-CM

## 2018-01-20 DIAGNOSIS — Z95828 Presence of other vascular implants and grafts: Secondary | ICD-10-CM

## 2018-01-20 LAB — CBC WITH DIFFERENTIAL (CANCER CENTER ONLY)
BASOS ABS: 0 10*3/uL (ref 0.0–0.1)
BASOS PCT: 1 %
Eosinophils Absolute: 0.1 10*3/uL (ref 0.0–0.5)
Eosinophils Relative: 2 %
HEMATOCRIT: 37.8 % (ref 34.8–46.6)
HEMOGLOBIN: 12.3 g/dL (ref 11.6–15.9)
LYMPHS PCT: 30 %
Lymphs Abs: 1.5 10*3/uL (ref 0.9–3.3)
MCH: 29.8 pg (ref 25.1–34.0)
MCHC: 32.5 g/dL (ref 31.5–36.0)
MCV: 91.5 fL (ref 79.5–101.0)
Monocytes Absolute: 0.5 10*3/uL (ref 0.1–0.9)
Monocytes Relative: 10 %
NEUTROS ABS: 2.8 10*3/uL (ref 1.5–6.5)
NEUTROS PCT: 57 %
Platelet Count: 256 10*3/uL (ref 145–400)
RBC: 4.13 MIL/uL (ref 3.70–5.45)
RDW: 13.9 % (ref 11.2–14.5)
WBC: 4.8 10*3/uL (ref 3.9–10.3)

## 2018-01-20 LAB — CMP (CANCER CENTER ONLY)
ALK PHOS: 85 U/L (ref 38–126)
ALT: 21 U/L (ref 0–44)
ANION GAP: 10 (ref 5–15)
AST: 19 U/L (ref 15–41)
Albumin: 3.9 g/dL (ref 3.5–5.0)
BILIRUBIN TOTAL: 0.4 mg/dL (ref 0.3–1.2)
BUN: 19 mg/dL (ref 6–20)
CALCIUM: 9.2 mg/dL (ref 8.9–10.3)
CO2: 24 mmol/L (ref 22–32)
Chloride: 108 mmol/L (ref 98–111)
Creatinine: 0.76 mg/dL (ref 0.44–1.00)
Glucose, Bld: 99 mg/dL (ref 70–99)
POTASSIUM: 4.1 mmol/L (ref 3.5–5.1)
Sodium: 142 mmol/L (ref 135–145)
TOTAL PROTEIN: 7 g/dL (ref 6.5–8.1)

## 2018-01-20 MED ORDER — HEPARIN SOD (PORK) LOCK FLUSH 100 UNIT/ML IV SOLN
500.0000 [IU] | Freq: Once | INTRAVENOUS | Status: AC | PRN
  Administered 2018-01-20: 500 [IU]
  Filled 2018-01-20: qty 5

## 2018-01-20 MED ORDER — ACETAMINOPHEN 325 MG PO TABS
ORAL_TABLET | ORAL | Status: AC
  Filled 2018-01-20: qty 2

## 2018-01-20 MED ORDER — SODIUM CHLORIDE 0.9% FLUSH
10.0000 mL | INTRAVENOUS | Status: DC | PRN
  Administered 2018-01-20: 10 mL via INTRAVENOUS
  Filled 2018-01-20: qty 10

## 2018-01-20 MED ORDER — LETROZOLE 2.5 MG PO TABS: 2.5000 mg | ORAL_TABLET | Freq: Every day | ORAL | 3 refills | Status: DC

## 2018-01-20 MED ORDER — SODIUM CHLORIDE 0.9% FLUSH
10.0000 mL | INTRAVENOUS | Status: DC | PRN
  Administered 2018-01-20: 10 mL
  Filled 2018-01-20: qty 10

## 2018-01-20 MED ORDER — TRASTUZUMAB CHEMO 150 MG IV SOLR
6.0000 mg/kg | Freq: Once | INTRAVENOUS | Status: AC
  Administered 2018-01-20: 378 mg via INTRAVENOUS
  Filled 2018-01-20: qty 18

## 2018-01-20 MED ORDER — ACETAMINOPHEN 325 MG PO TABS
650.0000 mg | ORAL_TABLET | Freq: Once | ORAL | Status: AC
  Administered 2018-01-20: 650 mg via ORAL

## 2018-01-20 MED ORDER — SODIUM CHLORIDE 0.9 % IV SOLN
Freq: Once | INTRAVENOUS | Status: AC
  Administered 2018-01-20: 10:00:00 via INTRAVENOUS
  Filled 2018-01-20: qty 250

## 2018-01-20 NOTE — Progress Notes (Signed)
Pt states echo scheduled for February 15, 2018.

## 2018-01-20 NOTE — Patient Instructions (Signed)
[facility] Discharge Instructions for Patients Receiving Chemotherapy  Today you received the following chemotherapy agents Herceptin  To help prevent nausea and vomiting after your treatment, we encourage you to take your nausea medication as directed   If you develop nausea and vomiting that is not controlled by your nausea medication, call the clinic.   BELOW ARE SYMPTOMS THAT SHOULD BE REPORTED IMMEDIATELY:  *FEVER GREATER THAN 100.5 F  *CHILLS WITH OR WITHOUT FEVER  NAUSEA AND VOMITING THAT IS NOT CONTROLLED WITH YOUR NAUSEA MEDICATION  *UNUSUAL SHORTNESS OF BREATH  *UNUSUAL BRUISING OR BLEEDING  TENDERNESS IN MOUTH AND THROAT WITH OR WITHOUT PRESENCE OF ULCERS  *URINARY PROBLEMS  *BOWEL PROBLEMS  UNUSUAL RASH Items with * indicate a potential emergency and should be followed up as soon as possible.  Feel free to call the clinic should you have any questions or concerns. The clinic phone number is [phone].  Please show the CHEMO ALERT CARD at check-in to the Emergency Department and triage nurse.   

## 2018-01-20 NOTE — Assessment & Plan Note (Signed)
04/04/2017:Left mastectomy: IDC grade 3, 3.8 cm, intermediate grade DCIS, invasive cancer comes 0.1 cm to posterior margin and less than 0.1 cm to anterior inferior margin at 0/13 lymph nodes, ER 100%, PR 0%, HER-2 positive ratio 2.08 T2 N0 stage IIb  Treatment plan: 1.Adjuvant chemotherapywithTHfollowed by Herceptin maintenance for 1 year. 2. Adjuvant antiestrogen therapy --------------------------------------------------------- Current treatment:Herceptinto be completed December 2019 Echo 11/17/2017: EF 60-65%   Herceptin toxicities: None Antiestrogen therapy counseling:We discussed the risks and benefits of anti-estrogen therapy with aromatase inhibitors. These include but not limited to insomnia, hot flashes, mood changes, vaginal dryness, bone density loss, and weight gain. We strongly believe that the benefits far outweigh the risks. Patient understands these risks and consented to starting treatment. Planned treatment duration is 5-7 years.  Return to clinic every 3 weeks for Herceptin every 6 weeks for follow-up with me

## 2018-01-20 NOTE — Progress Notes (Signed)
Patient Care Team: Molli Posey, MD as PCP - General (Obstetrics and Gynecology)  DIAGNOSIS:  Encounter Diagnosis  Name Primary?  . Malignant neoplasm of lower-inner quadrant of left breast in female, estrogen receptor positive ([place])     SUMMARY OF ONCOLOGIC HISTORY:   Malignant neoplasm of lower-inner quadrant of left breast in female, estrogen receptor positive ([place])   02/28/2017 Initial Diagnosis    Palpable left breast masses 2.5 cm at 7:00 and another 2.5 cm at 6:00 1.9 cm apart biopsy: IDC grade 2 with DCISER 100%, PR 0%, Ki-67 20%, HER-2 positiveratio 2.08, axilla negative; in addition indeterminate calcifications medially and laterally,T2 N0 stage 2A clinical stage AJCC 8    04/04/2017 Surgery    Left mastectomy: IDC grade 3, 3.8 cm, intermediate grade DCIS, invasive cancer comes 0.1 cm to posterior margin and less than 0.1 cm to anterior inferior margin at 0/13 lymph nodes, ER 100%, PR 0%, HER-2 positive ratio 2.08 T2 N0 stage IIb    05/20/2017 -  Chemotherapy    Taxol Herceptin weekly x12 followed by Herceptin maintenance every 3 weeks for 1 year (patient refused [place])      CHIEF COMPLIANT: Follow-up on Herceptin treatment, also to discuss antiestrogen therapy  INTERVAL HISTORY: Adriana Jones is a 62 year old with above-mentioned history of left breast cancer treated with mastectomy and is now on adjuvant Herceptin treatment appears to be tolerating it fairly well.  Her biggest complaint is her blood pressure with changes from very high to very low.  She attributes it to being on Herceptin treatment.  Her Herceptin finishes 05/05/2018.  Other than that she has no other complaints.  She is also here to discuss starting antiestrogen therapy.  She is very reluctant about taking any medications.  REVIEW OF SYSTEMS:   Constitutional: Denies fevers, chills or abnormal weight loss Eyes: Denies blurriness of vision Ears, nose, mouth, throat, and face: Denies mucositis or sore  throat Respiratory: Denies cough, dyspnea or wheezes Cardiovascular: Denies palpitation, chest discomfort Gastrointestinal:  Denies nausea, heartburn or change in bowel habits Skin: Denies abnormal skin rashes Lymphatics: Denies new lymphadenopathy or easy bruising Neurological:Denies numbness, tingling or new weaknesses Behavioral/Psych: Mood is stable, no new changes  Extremities: No lower extremity edema   All other systems were reviewed with the patient and are negative.  I have reviewed the past medical history, past surgical history, social history and family history with the patient and they are unchanged from previous note.  ALLERGIES:  is allergic to amoxicillin and sulfa antibiotics.  MEDICATIONS:  Current Outpatient Medications  Medication Sig Dispense Refill  . UNABLE TO FIND 100 mg 2 (two) times daily. EMPOWER     No current facility-administered medications for this visit.     PHYSICAL EXAMINATION: ECOG PERFORMANCE STATUS: 1 - Symptomatic but completely ambulatory  Vitals:   01/20/18 0901  BP: (!) 167/96  Pulse: 71  Resp: 17  Temp: 98 F (36.7 C)  SpO2: 100%   Filed Weights   01/20/18 0901  Weight: 144 lb 1.6 oz (65.4 kg)    GENERAL:alert, no distress and comfortable SKIN: skin color, texture, turgor are normal, no rashes or significant lesions EYES: normal, Conjunctiva are pink and non-injected, sclera clear OROPHARYNX:no exudate, no erythema and lips, buccal mucosa, and tongue normal  NECK: supple, thyroid normal size, non-tender, without nodularity LYMPH:  no palpable lymphadenopathy in the cervical, axillary or inguinal LUNGS: clear to auscultation and percussion with normal breathing effort HEART: regular rate & rhythm and  no murmurs and no lower extremity edema ABDOMEN:abdomen soft, non-tender and normal bowel sounds MUSCULOSKELETAL:no cyanosis of digits and no clubbing  NEURO: alert & oriented x 3 with fluent speech, no focal motor/sensory  deficits EXTREMITIES: No lower extremity edema   LABORATORY DATA:  I have reviewed the data as listed CMP Latest Ref Rng & Units 01/20/2018 12/30/2017 12/09/2017  Glucose 70 - 99 mg/dL 99 91 90  BUN 6 - 20 mg/dL '19 14 16  '$ Creatinine 0.44 - 1.00 mg/dL 0.76 0.68 0.71  Sodium 135 - 145 mmol/L 142 140 141  Potassium 3.5 - 5.1 mmol/L 4.1 4.0 4.0  Chloride 98 - 111 mmol/L 108 108 109  CO2 22 - 32 mmol/L '24 24 25  '$ Calcium 8.9 - 10.3 mg/dL 9.2 8.6(L) 9.1  Total Protein 6.5 - 8.1 g/dL 7.0 6.9 6.9  Total Bilirubin 0.3 - 1.2 mg/dL 0.4 0.5 0.3  Alkaline Phos 38 - 126 U/L 85 91 87  AST 15 - 41 U/L '19 15 17  '$ ALT 0 - 44 U/L '21 16 17    '$ Lab Results  Component Value Date   WBC 4.8 01/20/2018   HGB 12.3 01/20/2018   HCT 37.8 01/20/2018   MCV 91.5 01/20/2018   PLT 256 01/20/2018   NEUTROABS 2.8 01/20/2018    ASSESSMENT & PLAN:  Malignant neoplasm of lower-inner quadrant of left breast in female, estrogen receptor positive ([place]) 04/04/2017:Left mastectomy: IDC grade 3, 3.8 cm, intermediate grade DCIS, invasive cancer comes 0.1 cm to posterior margin and less than 0.1 cm to anterior inferior margin at 0/13 lymph nodes, ER 100%, PR 0%, HER-2 positive ratio 2.08 T2 N0 stage IIb  Treatment plan: 1.Adjuvant chemotherapywithTHfollowed by Herceptin maintenance for 1 year. 2. Adjuvant antiestrogen therapy --------------------------------------------------------- Current treatment:Herceptinto be completed December 2019 Echo 11/17/2017: EF 60-65%   Herceptin toxicities: None Antiestrogen therapy counseling:We discussed the risks and benefits of anti-estrogen therapy with aromatase inhibitors. These include but not limited to insomnia, hot flashes, mood changes, vaginal dryness, bone density loss, and weight gain. We strongly believe that the benefits far outweigh the risks. Patient understands these risks and consented to starting treatment. Planned treatment duration is 5-7 years.  Return to  clinic every 3 weeks for Herceptin every 6 weeks for follow-up with me    No orders of the defined types were placed in this encounter.  The patient has a good understanding of the overall plan. she agrees with it. she will call with any problems that may develop before the next visit here.   Harriette Ohara, MD 01/20/18

## 2018-01-31 ENCOUNTER — Encounter: Payer: Self-pay | Admitting: *Deleted

## 2018-01-31 ENCOUNTER — Ambulatory Visit (INDEPENDENT_AMBULATORY_CARE_PROVIDER_SITE_OTHER): Payer: BLUE CROSS/BLUE SHIELD

## 2018-01-31 DIAGNOSIS — I10 Essential (primary) hypertension: Secondary | ICD-10-CM | POA: Diagnosis not present

## 2018-01-31 NOTE — Progress Notes (Signed)
Patient ID: Adriana Jones, female   DOB: 1955/08/18, 62 y.o.   MRN: [id number] 24 hour ambulatory blood pressure monitor applied using standard adult cuff.

## 2018-02-10 ENCOUNTER — Inpatient Hospital Stay: Payer: BLUE CROSS/BLUE SHIELD | Attending: Hematology and Oncology

## 2018-02-10 ENCOUNTER — Inpatient Hospital Stay: Payer: BLUE CROSS/BLUE SHIELD

## 2018-02-10 VITALS — BP 179/86 | HR 72 | Temp 97.6°F | Resp 16

## 2018-02-10 DIAGNOSIS — Z170 Estrogen receptor positive status [ER+]: Principal | ICD-10-CM

## 2018-02-10 DIAGNOSIS — C50312 Malignant neoplasm of lower-inner quadrant of left female breast: Secondary | ICD-10-CM

## 2018-02-10 DIAGNOSIS — Z79899 Other long term (current) drug therapy: Secondary | ICD-10-CM | POA: Insufficient documentation

## 2018-02-10 DIAGNOSIS — Z5112 Encounter for antineoplastic immunotherapy: Secondary | ICD-10-CM | POA: Diagnosis not present

## 2018-02-10 DIAGNOSIS — Z87891 Personal history of nicotine dependence: Secondary | ICD-10-CM | POA: Diagnosis not present

## 2018-02-10 DIAGNOSIS — Z95828 Presence of other vascular implants and grafts: Secondary | ICD-10-CM

## 2018-02-10 LAB — CBC WITH DIFFERENTIAL (CANCER CENTER ONLY)
Basophils Absolute: 0 10*3/uL (ref 0.0–0.1)
Basophils Relative: 1 %
EOS PCT: 1 %
Eosinophils Absolute: 0.1 10*3/uL (ref 0.0–0.5)
HEMATOCRIT: 38.9 % (ref 34.8–46.6)
Hemoglobin: 12.4 g/dL (ref 11.6–15.9)
LYMPHS PCT: 28 %
Lymphs Abs: 1.5 10*3/uL (ref 0.9–3.3)
MCH: 29.5 pg (ref 25.1–34.0)
MCHC: 31.9 g/dL (ref 31.5–36.0)
MCV: 92.6 fL (ref 79.5–101.0)
MONO ABS: 0.6 10*3/uL (ref 0.1–0.9)
Monocytes Relative: 10 %
NEUTROS ABS: 3.2 10*3/uL (ref 1.5–6.5)
Neutrophils Relative %: 60 %
PLATELETS: 246 10*3/uL (ref 145–400)
RBC: 4.2 MIL/uL (ref 3.70–5.45)
RDW: 13.8 % (ref 11.2–14.5)
WBC Count: 5.3 10*3/uL (ref 3.9–10.3)

## 2018-02-10 LAB — CMP (CANCER CENTER ONLY)
ALK PHOS: 87 U/L (ref 38–126)
ALT: 18 U/L (ref 0–44)
ANION GAP: 9 (ref 5–15)
AST: 19 U/L (ref 15–41)
Albumin: 3.8 g/dL (ref 3.5–5.0)
BILIRUBIN TOTAL: 0.4 mg/dL (ref 0.3–1.2)
BUN: 16 mg/dL (ref 6–20)
CO2: 24 mmol/L (ref 22–32)
Calcium: 9.4 mg/dL (ref 8.9–10.3)
Chloride: 108 mmol/L (ref 98–111)
Creatinine: 0.76 mg/dL (ref 0.44–1.00)
GFR, Est AFR Am: >60 mL/min (ref >60)
GFR, Estimated: >60 mL/min (ref >60)
GLUCOSE: 90 mg/dL (ref 70–99)
POTASSIUM: 4.3 mmol/L (ref 3.5–5.1)
SODIUM: 141 mmol/L (ref 135–145)
Total Protein: 7 g/dL (ref 6.5–8.1)

## 2018-02-10 MED ORDER — HEPARIN SOD (PORK) LOCK FLUSH 100 UNIT/ML IV SOLN
500.0000 [IU] | Freq: Once | INTRAVENOUS | Status: AC | PRN
  Administered 2018-02-10: 500 [IU]
  Filled 2018-02-10: qty 5

## 2018-02-10 MED ORDER — SODIUM CHLORIDE 0.9% FLUSH
10.0000 mL | INTRAVENOUS | Status: DC | PRN
  Administered 2018-02-10: 10 mL via INTRAVENOUS
  Filled 2018-02-10: qty 10

## 2018-02-10 MED ORDER — SODIUM CHLORIDE 0.9% FLUSH
10.0000 mL | INTRAVENOUS | Status: DC | PRN
  Administered 2018-02-10: 10 mL
  Filled 2018-02-10: qty 10

## 2018-02-10 MED ORDER — SODIUM CHLORIDE 0.9 % IV SOLN
Freq: Once | INTRAVENOUS | Status: AC
  Administered 2018-02-10: 10:00:00 via INTRAVENOUS
  Filled 2018-02-10: qty 250

## 2018-02-10 MED ORDER — TRASTUZUMAB CHEMO 150 MG IV SOLR
6.0000 mg/kg | Freq: Once | INTRAVENOUS | Status: AC
  Administered 2018-02-10: 378 mg via INTRAVENOUS
  Filled 2018-02-10: qty 18

## 2018-02-10 MED ORDER — ACETAMINOPHEN 325 MG PO TABS
650.0000 mg | ORAL_TABLET | Freq: Once | ORAL | Status: AC
  Administered 2018-02-10: 650 mg via ORAL

## 2018-02-10 MED ORDER — ACETAMINOPHEN 325 MG PO TABS
ORAL_TABLET | ORAL | Status: AC
  Filled 2018-02-10: qty 2

## 2018-02-10 NOTE — Patient Instructions (Signed)
[facility] Discharge Instructions for Patients Receiving Chemotherapy  Today you received the following chemotherapy agents Herceptin  To help prevent nausea and vomiting after your treatment, we encourage you to take your nausea medication as directed   If you develop nausea and vomiting that is not controlled by your nausea medication, call the clinic.   BELOW ARE SYMPTOMS THAT SHOULD BE REPORTED IMMEDIATELY:  *FEVER GREATER THAN 100.5 F  *CHILLS WITH OR WITHOUT FEVER  NAUSEA AND VOMITING THAT IS NOT CONTROLLED WITH YOUR NAUSEA MEDICATION  *UNUSUAL SHORTNESS OF BREATH  *UNUSUAL BRUISING OR BLEEDING  TENDERNESS IN MOUTH AND THROAT WITH OR WITHOUT PRESENCE OF ULCERS  *URINARY PROBLEMS  *BOWEL PROBLEMS  UNUSUAL RASH Items with * indicate a potential emergency and should be followed up as soon as possible.  Feel free to call the clinic should you have any questions or concerns. The clinic phone number is [phone].  Please show the CHEMO ALERT CARD at check-in to the Emergency Department and triage nurse.   

## 2018-02-15 ENCOUNTER — Ambulatory Visit (HOSPITAL_BASED_OUTPATIENT_CLINIC_OR_DEPARTMENT_OTHER)
Admission: RE | Admit: 2018-02-15 | Discharge: 2018-02-15 | Disposition: A | Discharge status: Home / Self Care | Payer: BLUE CROSS/BLUE SHIELD | Source: Ambulatory Visit | Attending: Internal Medicine | Admitting: Internal Medicine

## 2018-02-15 ENCOUNTER — Ambulatory Visit (HOSPITAL_COMMUNITY)
Admission: RE | Admit: 2018-02-15 | Discharge: 2018-02-15 | Disposition: A | Discharge status: Home / Self Care | Payer: BLUE CROSS/BLUE SHIELD | Source: Ambulatory Visit | Attending: Obstetrics and Gynecology | Admitting: Obstetrics and Gynecology

## 2018-02-15 ENCOUNTER — Other Ambulatory Visit: Payer: Self-pay

## 2018-02-15 VITALS — BP 196/96 | HR 88 | Wt 146.0 lb

## 2018-02-15 DIAGNOSIS — I10 Essential (primary) hypertension: Secondary | ICD-10-CM

## 2018-02-15 DIAGNOSIS — F419 Anxiety disorder, unspecified: Secondary | ICD-10-CM | POA: Insufficient documentation

## 2018-02-15 DIAGNOSIS — Z87891 Personal history of nicotine dependence: Secondary | ICD-10-CM | POA: Diagnosis not present

## 2018-02-15 DIAGNOSIS — Z9012 Acquired absence of left breast and nipple: Secondary | ICD-10-CM | POA: Diagnosis not present

## 2018-02-15 DIAGNOSIS — Z170 Estrogen receptor positive status [ER+]: Secondary | ICD-10-CM | POA: Diagnosis not present

## 2018-02-15 DIAGNOSIS — I340 Nonrheumatic mitral (valve) insufficiency: Secondary | ICD-10-CM | POA: Insufficient documentation

## 2018-02-15 DIAGNOSIS — C50312 Malignant neoplasm of lower-inner quadrant of left female breast: Secondary | ICD-10-CM | POA: Diagnosis not present

## 2018-02-15 NOTE — Patient Instructions (Signed)
Your physician recommends that you schedule a follow-up appointment in: 3 months with echocardiogram  

## 2018-02-15 NOTE — Progress Notes (Unsigned)
Cardio-Oncology Clinic Note   Referring Physician: Dr. Lindi Adie Primary Cardiologist: New (Dr. Haroldine Laws)  HPI: Adriana Jones is a 62 y.o. female with past medical history of Left Breast CA who is referred by Dr. Lindi Adie  to establish in the cardio-oncology clinic for monitoring of cardio-toxicty while undergoing chemotherapy.  Cancer Profile  Malignant neoplasm of lower-inner quadrant of left breast in female, estrogen receptor positive ([place]) 02/28/2017:Palpable left breast masses 2.5 cm at 7:00 and another 2.5 cm at 6:00 1.9 cm apart biopsy: IDC grade 2 with DCIS ER 100%, PR 0%, Ki-67 20%, HER-2 positiveratio 2.08, axilla negative; in addition indeterminate calcifications medially and laterally,T2 N0 stage 2A clinical stage AJCC 8     Malignant neoplasm of lower-inner quadrant of left breast in female, estrogen receptor positive ([place])   02/28/2017 Initial Diagnosis    Palpable left breast masses 2.5 cm at 7:00 and another 2.5 cm at 6:00 1.9 cm apart biopsy: IDC grade 2 with DCISER 100%, PR 0%, Ki-67 20%, HER-2 positiveratio 2.08, axilla negative; in addition indeterminate calcifications medially and laterally,T2 N0 stage 2A clinical stage AJCC 8      04/04/2017 Surgery    Left mastectomy: IDC grade 3, 3.8 cm, intermediate grade DCIS, invasive cancer comes 0.1 cm to posterior margin and less than 0.1 cm to anterior inferior margin at 0/13 lymph nodes, ER 100%, PR 0%, HER-2 positive ratio 2.08 T2 N0 stage IIb      05/20/2017 -  Chemotherapy    Taxol Herceptin weekly x12 followed by Herceptin maintenance every 3 weeks for 1 year (patient refused [facility])        Doing well. Remains on Herceptin alone every 3 weeks. No problems tolerating. Walking dogs. No SOB, edema, orthopnea or PND.   Echo today (02/15/18)  EF 60-65% GLS -14.3 (underestimated)   Echo 11/17/17 EF 60-65% GLS -21.5% Personally reviewed Echo 3/19  EF 60-65% GLS -22.2 LS 9.1  Echo 11/18 - EF 60-65% GLS -22.9  Lateral S'10.6    Past Medical History:  Diagnosis Date  . Anxiety   . Cancer [facility])    breast  . Headache     Current Outpatient Medications  Medication Sig Dispense Refill  . BORON PO Take 2 mg by mouth daily.    . Calcium Carbonate-Vit D-Min (CALCIUM 1200 PO) Take by mouth daily.    Marland Kitchen letrozole (FEMARA) 2.5 MG tablet Take 1 tablet (2.5 mg total) by mouth daily. 90 tablet 3  . Magnesium 100 MG CAPS Take by mouth daily.    Marland Kitchen OVER THE COUNTER MEDICATION Take 1,000 mg by mouth daily. [place]    . Vitamin D, Cholecalciferol, 1000 units TABS Take 6,000 Units by mouth daily.     No current facility-administered medications for this visit.     Allergies  Allergen Reactions  . Amoxicillin Hives    Has patient had a PCN reaction causing immediate rash, facial/tongue/throat swelling, SOB or lightheadedness with hypotension: No Has patient had a PCN reaction causing severe rash involving mucus membranes or skin necrosis: No Has patient had a PCN reaction that required hospitalization: No Has patient had a PCN reaction occurring within the last 10 years: Yes If all of the above answers are "NO", then may proceed with Cephalosporin use.   . Sulfa Antibiotics Hives      Social History   Socioeconomic History  . Marital status: Married    Spouse name: Not on file  . Number of children: Not on file  . Years  of education: Not on file  . Highest education level: Not on file  Occupational History  . Not on file  Social Needs  . Financial resource strain: Not on file  . Food insecurity:    Worry: Not on file    Inability: Not on file  . Transportation needs:    Medical: Not on file    Non-medical: Not on file  Tobacco Use  . Smoking status: Former Smoker    Years: 3.00    Types: E-cigarettes    Start date: 08/07/2013  . Smokeless tobacco: Never Used  Substance and Sexual Activity  . Alcohol use: Yes    Comment: 4-10/wk  . Drug use: No  . Sexual activity: Not on file    Lifestyle  . Physical activity:    Days per week: Not on file    Minutes per session: Not on file  . Stress: Not on file  Relationships  . Social connections:    Talks on phone: Not on file    Gets together: Not on file    Attends religious service: Not on file    Active member of club or organization: Not on file    Attends meetings of clubs or organizations: Not on file    Relationship status: Not on file  . Intimate partner violence:    Fear of current or ex partner: Not on file    Emotionally abused: Not on file    Physically abused: Not on file    Forced sexual activity: Not on file  Other Topics Concern  . Not on file  Social History Narrative  . Not on file   FHx: No family history of coronary disease.   There were no vitals filed for this visit.   PHYSICAL EXAM: General:  Well appearing. No resp difficulty HEENT: normal Neck: supple. no JVD. Carotids 2+ bilat; no bruits. No lymphadenopathy or thryomegaly appreciated. Cor: PMI nondisplaced. Regular rate & rhythm. No rubs, gallops or murmurs. +portcath  Lungs: clear Abdomen: soft, nontender, nondistended. No hepatosplenomegaly. No bruits or masses. Good bowel sounds. Extremities: no cyanosis, clubbing, rash, edema Neuro: alert & orientedx3, cranial nerves grossly intact. moves all 4 extremities w/o difficulty. Affect pleasant   ASSESSMENT & PLAN:  1. Breast CA of Lower-Inner quadrant of left breast in female, Estrogen Receptor + - I reviewed echos personally. EF and Doppler parameters stable. No HF on exam. Continue Herceptin.  2. HTN - BP remains elevated in the office and at the [facility]. However BPs at home seem well controlled. Says SBP 140 in the morning and 115-120 in the afternoon.  Me be a component of white-coat HTN.  - will check ambulatory BP monitor to further assess  Glori Bickers, MD 02/15/18 11:58 AM

## 2018-02-15 NOTE — Progress Notes (Signed)
Cardio-Oncology Clinic Note   Referring Physician: Dr. Lindi Adie Primary Cardiologist: New (Dr. Haroldine Laws)  HPI: Adriana Jones is a 62 y.o. female with past medical history of Left Breast CA who is referred by Dr. Lindi Adie  to establish in the cardio-oncology clinic for monitoring of cardio-toxicty while undergoing chemotherapy.  Cancer Profile  Malignant neoplasm of lower-inner quadrant of left breast in female, estrogen receptor positive ([place]) 02/28/2017:Palpable left breast masses 2.5 cm at 7:00 and another 2.5 cm at 6:00 1.9 cm apart biopsy: IDC grade 2 with DCIS ER 100%, PR 0%, Ki-67 20%, HER-2 positiveratio 2.08, axilla negative; in addition indeterminate calcifications medially and laterally,T2 N0 stage 2A clinical stage AJCC 8     Malignant neoplasm of lower-inner quadrant of left breast in female, estrogen receptor positive ([place])   02/28/2017 Initial Diagnosis    Palpable left breast masses 2.5 cm at 7:00 and another 2.5 cm at 6:00 1.9 cm apart biopsy: IDC grade 2 with DCISER 100%, PR 0%, Ki-67 20%, HER-2 positiveratio 2.08, axilla negative; in addition indeterminate calcifications medially and laterally,T2 N0 stage 2A clinical stage AJCC 8      04/04/2017 Surgery    Left mastectomy: IDC grade 3, 3.8 cm, intermediate grade DCIS, invasive cancer comes 0.1 cm to posterior margin and less than 0.1 cm to anterior inferior margin at 0/13 lymph nodes, ER 100%, PR 0%, HER-2 positive ratio 2.08 T2 N0 stage IIb      05/20/2017 -  Chemotherapy    Taxol Herceptin weekly x12 followed by Herceptin maintenance every 3 weeks for 1 year (patient refused [facility])        Doing well. Remains on Herceptin alone every 3 weeks. Will finish in December 2019. No problems with infusions. 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 Working out on a Radi[employer], therapeutic. Has been losing weight.  No SOB, orthopnea or edema.   No family history of coronary disease.   Echo today (02/15/18)  EF 60-65% GLS  -14.3% (underestimated)  Echo 11/17/17 EF 60-65% GLS -21.5% Personally reviewed Echo 3/19  EF 60-65% GLS -22.2 LS 9.1  Echo 11/18 - EF 60-65% GLS -22.9 Lateral S'10.6    Past Medical History:  Diagnosis Date  . Anxiety   . Cancer [facility])    breast  . Headache     Current Outpatient Medications  Medication Sig Dispense Refill  . letrozole (FEMARA) 2.5 MG tablet Take 1 tablet (2.5 mg total) by mouth daily. 90 tablet 3   No current facility-administered medications for this encounter.     Allergies  Allergen Reactions  . Amoxicillin Hives    Has patient had a PCN reaction causing immediate rash, facial/tongue/throat swelling, SOB or lightheadedness with hypotension: No Has patient had a PCN reaction causing severe rash involving mucus membranes or skin necrosis: No Has patient had a PCN reaction that required hospitalization: No Has patient had a PCN reaction occurring within the last 10 years: Yes If all of the above answers are "NO", then may proceed with Cephalosporin use.   . Sulfa Antibiotics Hives      Social History   Socioeconomic History  . Marital status: Married    Spouse name: Not on file  . Number of children: Not on file  . Years of education: Not on file  . Highest education level: Not on file  Occupational History  . Not on file  Social Needs  . Financial resource strain: Not on file  . Food insecurity:    Worry: Not on  file    Inability: Not on file  . Transportation needs:    Medical: Not on file    Non-medical: Not on file  Tobacco Use  . Smoking status: Former Smoker    Years: 3.00    Types: E-cigarettes    Start date: 08/07/2013  . Smokeless tobacco: Never Used  Substance and Sexual Activity  . Alcohol use: Yes    Comment: 4-10/wk  . Drug use: No  . Sexual activity: Not on file  Lifestyle  . Physical activity:    Days per week: Not on file    Minutes per session: Not on file  . Stress: Not on file  Relationships  . Social connections:     Talks on phone: Not on file    Gets together: Not on file    Attends religious service: Not on file    Active member of club or organization: Not on file    Attends meetings of clubs or organizations: Not on file    Relationship status: Not on file  . Intimate partner violence:    Fear of current or ex partner: Not on file    Emotionally abused: Not on file    Physically abused: Not on file    Forced sexual activity: Not on file  Other Topics Concern  . Not on file  Social History Narrative  . Not on file   FHx: No family history of coronary disease.   Vitals:   02/15/18 1144  BP: (!) 196/96  Pulse: 88  SpO2: 100%  Weight: 66.2 kg (146 lb)     PHYSICAL EXAM: General:  Well appearing. No resp difficulty HEENT: normal Neck: supple. no JVD. Carotids 2+ bilat; no bruits. No lymphadenopathy or thryomegaly appreciated. Cor: PMI nondisplaced. Regular rate & rhythm. No rubs, gallops or murmurs. R port a cath Lungs: clear Abdomen: soft, nontender, nondistended. No hepatosplenomegaly. No bruits or masses. Good bowel sounds. Extremities: no cyanosis, clubbing, rash, edema Neuro: alert & orientedx3, cranial nerves grossly intact. moves all 4 extremities w/o difficulty. Affect pleasant   ASSESSMENT & PLAN:  1. Breast CA of Lower-Inner quadrant of left breast in female, Estrogen Receptor + - I reviewed echos personally. EF and Doppler parameters stable. No HF on exam. Continue Herceptin.  2. HTN - BP remains elevated at times. - We placed 24-hour BP monitor on 9/10. Results reviewed. Overall BP 137/77 which is fine. However BP does go up significantly with emotion stress with peak BPs 195/128. - At this point no indication to start anti-HTN but does need to focus on stress reduction. She will keep BP log at home and we will review in 3 months. Can repeat 24-hour monitor as needed  Glori Bickers, MD 02/15/18 11:53 AM

## 2018-02-15 NOTE — Progress Notes (Signed)
  Echocardiogram 2D Echocardiogram has been performed.  Adriana Jones 02/15/2018, 11:46 AM

## 2018-02-15 NOTE — Addendum Note (Signed)
Encounter addended by: Scarlette Calico, RN on: 02/15/2018 12:15 PM  Actions taken: Sign clinical note

## 2018-02-15 NOTE — Addendum Note (Signed)
Encounter addended by: Scarlette Calico, RN on: 02/15/2018 12:19 PM  Actions taken: Order list changed, Diagnosis association updated

## 2018-03-03 ENCOUNTER — Inpatient Hospital Stay (HOSPITAL_BASED_OUTPATIENT_CLINIC_OR_DEPARTMENT_OTHER): Payer: BLUE CROSS/BLUE SHIELD | Admitting: Hematology and Oncology

## 2018-03-03 ENCOUNTER — Inpatient Hospital Stay: Payer: BLUE CROSS/BLUE SHIELD

## 2018-03-03 ENCOUNTER — Other Ambulatory Visit: Payer: Self-pay | Admitting: Hematology and Oncology

## 2018-03-03 ENCOUNTER — Inpatient Hospital Stay: Payer: BLUE CROSS/BLUE SHIELD | Attending: Hematology and Oncology

## 2018-03-03 ENCOUNTER — Telehealth: Payer: Self-pay | Admitting: Hematology and Oncology

## 2018-03-03 DIAGNOSIS — Z5112 Encounter for antineoplastic immunotherapy: Secondary | ICD-10-CM | POA: Insufficient documentation

## 2018-03-03 DIAGNOSIS — C50312 Malignant neoplasm of lower-inner quadrant of left female breast: Secondary | ICD-10-CM

## 2018-03-03 DIAGNOSIS — Z79811 Long term (current) use of aromatase inhibitors: Secondary | ICD-10-CM | POA: Insufficient documentation

## 2018-03-03 DIAGNOSIS — Z9012 Acquired absence of left breast and nipple: Secondary | ICD-10-CM | POA: Insufficient documentation

## 2018-03-03 DIAGNOSIS — Z170 Estrogen receptor positive status [ER+]: Principal | ICD-10-CM

## 2018-03-03 DIAGNOSIS — Z1231 Encounter for screening mammogram for malignant neoplasm of breast: Secondary | ICD-10-CM

## 2018-03-03 DIAGNOSIS — Z95828 Presence of other vascular implants and grafts: Secondary | ICD-10-CM

## 2018-03-03 LAB — CBC WITH DIFFERENTIAL (CANCER CENTER ONLY)
Abs Immature Granulocytes: 0.01 10*3/uL (ref 0.00–0.07)
Basophils Absolute: 0 10*3/uL (ref 0.0–0.1)
Basophils Relative: 1 %
Eosinophils Absolute: 0.1 10*3/uL (ref 0.0–0.5)
Eosinophils Relative: 2 %
HEMATOCRIT: 39.2 % (ref 36.0–46.0)
HEMOGLOBIN: 12.7 g/dL (ref 12.0–15.0)
Immature Granulocytes: 0 %
LYMPHS ABS: 1.4 10*3/uL (ref 0.7–4.0)
LYMPHS PCT: 28 %
MCH: 29.8 pg (ref 26.0–34.0)
MCHC: 32.4 g/dL (ref 30.0–36.0)
MCV: 92 fL (ref 80.0–100.0)
MONO ABS: 0.6 10*3/uL (ref 0.1–1.0)
MONOS PCT: 12 %
Neutro Abs: 2.9 10*3/uL (ref 1.7–7.7)
Neutrophils Relative %: 57 %
Platelet Count: 259 10*3/uL (ref 150–400)
RBC: 4.26 MIL/uL (ref 3.87–5.11)
RDW: 13.4 % (ref 11.5–15.5)
WBC Count: 5.1 10*3/uL (ref 4.0–10.5)
nRBC: 0 % (ref 0.0–0.2)

## 2018-03-03 LAB — CMP (CANCER CENTER ONLY)
ALK PHOS: 79 U/L (ref 38–126)
ALT: 17 U/L (ref 0–44)
AST: 17 U/L (ref 15–41)
Albumin: 4 g/dL (ref 3.5–5.0)
Anion gap: 10 (ref 5–15)
BUN: 11 mg/dL (ref 6–20)
CO2: 25 mmol/L (ref 22–32)
Calcium: 9.4 mg/dL (ref 8.9–10.3)
Chloride: 106 mmol/L (ref 98–111)
Creatinine: 0.73 mg/dL (ref 0.44–1.00)
GFR, Est AFR Am: >60 mL/min (ref >60)
GFR, Estimated: >60 mL/min (ref >60)
Glucose, Bld: 100 mg/dL — ABNORMAL HIGH (ref 70–99)
Potassium: 4.1 mmol/L (ref 3.5–5.1)
SODIUM: 141 mmol/L (ref 135–145)
Total Bilirubin: 0.4 mg/dL (ref 0.3–1.2)
Total Protein: 7.3 g/dL (ref 6.5–8.1)

## 2018-03-03 MED ORDER — SODIUM CHLORIDE 0.9% FLUSH
10.0000 mL | INTRAVENOUS | Status: DC | PRN
  Administered 2018-03-03: 10 mL via INTRAVENOUS
  Filled 2018-03-03: qty 10

## 2018-03-03 MED ORDER — TRASTUZUMAB CHEMO 150 MG IV SOLR
6.0000 mg/kg | Freq: Once | INTRAVENOUS | Status: AC
  Administered 2018-03-03: 378 mg via INTRAVENOUS
  Filled 2018-03-03: qty 18

## 2018-03-03 MED ORDER — SODIUM CHLORIDE 0.9 % IV SOLN
Freq: Once | INTRAVENOUS | Status: AC
  Administered 2018-03-03: 11:00:00 via INTRAVENOUS
  Filled 2018-03-03: qty 250

## 2018-03-03 MED ORDER — HEPARIN SOD (PORK) LOCK FLUSH 100 UNIT/ML IV SOLN
500.0000 [IU] | Freq: Once | INTRAVENOUS | Status: AC | PRN
  Administered 2018-03-03: 500 [IU]
  Filled 2018-03-03: qty 5

## 2018-03-03 MED ORDER — SODIUM CHLORIDE 0.9% FLUSH
10.0000 mL | INTRAVENOUS | Status: DC | PRN
  Administered 2018-03-03: 10 mL
  Filled 2018-03-03: qty 10

## 2018-03-03 MED ORDER — ACETAMINOPHEN 325 MG PO TABS
650.0000 mg | ORAL_TABLET | Freq: Once | ORAL | Status: AC
  Administered 2018-03-03: 650 mg via ORAL

## 2018-03-03 MED ORDER — ACETAMINOPHEN 325 MG PO TABS
ORAL_TABLET | ORAL | Status: AC
  Filled 2018-03-03: qty 2

## 2018-03-03 NOTE — Patient Instructions (Signed)
[facility] Discharge Instructions for Patients Receiving Chemotherapy  Today you received the following chemotherapy agents Herceptin  To help prevent nausea and vomiting after your treatment, we encourage you to take your nausea medication as directed   If you develop nausea and vomiting that is not controlled by your nausea medication, call the clinic.   BELOW ARE SYMPTOMS THAT SHOULD BE REPORTED IMMEDIATELY:  *FEVER GREATER THAN 100.5 F  *CHILLS WITH OR WITHOUT FEVER  NAUSEA AND VOMITING THAT IS NOT CONTROLLED WITH YOUR NAUSEA MEDICATION  *UNUSUAL SHORTNESS OF BREATH  *UNUSUAL BRUISING OR BLEEDING  TENDERNESS IN MOUTH AND THROAT WITH OR WITHOUT PRESENCE OF ULCERS  *URINARY PROBLEMS  *BOWEL PROBLEMS  UNUSUAL RASH Items with * indicate a potential emergency and should be followed up as soon as possible.  Feel free to call the clinic should you have any questions or concerns. The clinic phone number is [phone].  Please show the CHEMO ALERT CARD at check-in to the Emergency Department and triage nurse.   

## 2018-03-03 NOTE — Assessment & Plan Note (Signed)
04/04/2017:Left mastectomy: IDC grade 3, 3.8 cm, intermediate grade DCIS, invasive cancer comes 0.1 cm to posterior margin and less than 0.1 cm to anterior inferior margin at 0/13 lymph nodes, ER 100%, PR 0%, HER-2 positive ratio 2.08 T2 N0 stage IIb  Treatment plan: 1.Adjuvant chemotherapywithTHfollowed by Herceptin maintenance for 1 year. 2. Adjuvant antiestrogen therapy --------------------------------------------------------- Current treatment:Herceptinto be completed December 2019 Echo 11/17/2017: EF 60-65%   Herceptin toxicities: None Letrozole toxicities:  Breast cancer surveillance:Right breast mammogram will need to be scheduled  Return to clinic every 3 weeks for Herceptin every 6 weeks for follow-up with me 

## 2018-03-03 NOTE — Patient Instructions (Signed)
Implanted Port Home Guide An implanted port is a type of central line that is placed under the skin. Central lines are used to provide IV access when treatment or nutrition needs to be given through a person's veins. Implanted ports are used for long-term IV access. An implanted port may be placed because:  You need IV medicine that would be irritating to the small veins in your hands or arms.  You need long-term IV medicines, such as antibiotics.  You need IV nutrition for a long period.  You need frequent blood draws for lab tests.  You need dialysis.  Implanted ports are usually placed in the chest area, but they can also be placed in the upper arm, the abdomen, or the leg. An implanted port has two main parts:  Reservoir. The reservoir is round and will appear as a small, raised area under your skin. The reservoir is the part where a needle is inserted to give medicines or draw blood.  Catheter. The catheter is a thin, flexible tube that extends from the reservoir. The catheter is placed into a large vein. Medicine that is inserted into the reservoir goes into the catheter and then into the vein.  How will I care for my incision site? Do not get the incision site wet. Bathe or shower as directed by your health care provider. How is my port accessed? Special steps must be taken to access the port:  Before the port is accessed, a numbing cream can be placed on the skin. This helps numb the skin over the port site.  Your health care provider uses a sterile technique to access the port. ? Your health care provider must put on a mask and sterile gloves. ? The skin over your port is cleaned carefully with an antiseptic and allowed to dry. ? The port is gently pinched between sterile gloves, and a needle is inserted into the port.  Only "non-coring" port needles should be used to access the port. Once the port is accessed, a blood return should be checked. This helps ensure that the port  is in the vein and is not clogged.  If your port needs to remain accessed for a constant infusion, a clear (transparent) bandage will be placed over the needle site. The bandage and needle will need to be changed every week, or as directed by your health care provider.  Keep the bandage covering the needle clean and dry. Do not get it wet. Follow your health care provider's instructions on how to take a shower or bath while the port is accessed.  If your port does not need to stay accessed, no bandage is needed over the port.  What is flushing? Flushing helps keep the port from getting clogged. Follow your health care provider's instructions on how and when to flush the port. Ports are usually flushed with saline solution or a medicine called heparin. The need for flushing will depend on how the port is used.  If the port is used for intermittent medicines or blood draws, the port will need to be flushed: ? After medicines have been given. ? After blood has been drawn. ? As part of routine maintenance.  If a constant infusion is running, the port may not need to be flushed.  How long will my port stay implanted? The port can stay in for as long as your health care provider thinks it is needed. When it is time for the port to come out, surgery will be   done to remove it. The procedure is similar to the one performed when the port was put in. When should I seek immediate medical care? When you have an implanted port, you should seek immediate medical care if:  You notice a bad smell coming from the incision site.  You have swelling, redness, or drainage at the incision site.  You have more swelling or pain at the port site or the surrounding area.  You have a fever that is not controlled with medicine.  This information is not intended to replace advice given to you by your health care provider. Make sure you discuss any questions you have with your health care provider. Document  Released: 05/10/2005 Document Revised: 10/16/2015 Document Reviewed: 01/15/2013 [employer] Interactive Patient Education  2017 [employer]  

## 2018-03-03 NOTE — Progress Notes (Signed)
Patient Care Team: Molli Posey, MD as PCP - General (Obstetrics and Gynecology)  DIAGNOSIS:  Encounter Diagnosis  Name Primary?  . Malignant neoplasm of lower-inner quadrant of left breast in female, estrogen receptor positive ([place])     SUMMARY OF ONCOLOGIC HISTORY:   Malignant neoplasm of lower-inner quadrant of left breast in female, estrogen receptor positive ([place])   02/28/2017 Initial Diagnosis    Palpable left breast masses 2.5 cm at 7:00 and another 2.5 cm at 6:00 1.9 cm apart biopsy: IDC grade 2 with DCISER 100%, PR 0%, Ki-67 20%, HER-2 positiveratio 2.08, axilla negative; in addition indeterminate calcifications medially and laterally,T2 N0 stage 2A clinical stage AJCC 8    04/04/2017 Surgery    Left mastectomy: IDC grade 3, 3.8 cm, intermediate grade DCIS, invasive cancer comes 0.1 cm to posterior margin and less than 0.1 cm to anterior inferior margin at 0/13 lymph nodes, ER 100%, PR 0%, HER-2 positive ratio 2.08 T2 N0 stage IIb    05/20/2017 -  Chemotherapy    Taxol Herceptin weekly x12 followed by Herceptin maintenance every 3 weeks for 1 year (patient refused [place])      CHIEF COMPLIANT: Follow-up on Herceptin and letrozole  INTERVAL HISTORY: Adriana Jones is a 62 year old with above-mentioned history of left breast cancer treated with mastectomy and has completed Taxol Herceptin and is now on Herceptin maintenance.  She is tolerating letrozole very well.  She has very mild hot flashes.  Denies any arthralgias or myalgias.  REVIEW OF SYSTEMS:   Constitutional: Denies fevers, chills or abnormal weight loss Eyes: Denies blurriness of vision Ears, nose, mouth, throat, and face: Denies mucositis or sore throat Respiratory: Denies cough, dyspnea or wheezes Cardiovascular: Denies palpitation, chest discomfort Gastrointestinal:  Denies nausea, heartburn or change in bowel habits Skin: Denies abnormal skin rashes Lymphatics: Denies new lymphadenopathy or easy  bruising Neurological:Denies numbness, tingling or new weaknesses Behavioral/Psych: Mood is stable, no new changes  Extremities: No lower extremity edema Breast:  denies any pain or lumps or nodules in either breasts All other systems were reviewed with the patient and are negative.  I have reviewed the past medical history, past surgical history, social history and family history with the patient and they are unchanged from previous note.  ALLERGIES:  is allergic to amoxicillin and sulfa antibiotics.  MEDICATIONS:  Current Outpatient Medications  Medication Sig Dispense Refill  . BORON PO Take 2 mg by mouth daily.    . Calcium Carbonate-Vit D-Min (CALCIUM 1200 PO) Take by mouth daily.    Marland Kitchen letrozole (FEMARA) 2.5 MG tablet Take 1 tablet (2.5 mg total) by mouth daily. 90 tablet 3  . Magnesium 100 MG CAPS Take by mouth daily.    Marland Kitchen OVER THE COUNTER MEDICATION Take 1,000 mg by mouth daily. [place]    . Vitamin D, Cholecalciferol, 1000 units TABS Take 6,000 Units by mouth daily.     No current facility-administered medications for this visit.     PHYSICAL EXAMINATION: ECOG PERFORMANCE STATUS: 1 - Symptomatic but completely ambulatory  Vitals:   03/03/18 0924  BP: (!) 164/85  Pulse: 73  Resp: 16  Temp: 98.2 F (36.8 C)  SpO2: 99%   Filed Weights   03/03/18 0924  Weight: 145 lb 6.4 oz (66 kg)    GENERAL:alert, no distress and comfortable SKIN: skin color, texture, turgor are normal, no rashes or significant lesions EYES: normal, Conjunctiva are pink and non-injected, sclera clear OROPHARYNX:no exudate, no erythema and lips, buccal mucosa,  and tongue normal  NECK: supple, thyroid normal size, non-tender, without nodularity LYMPH:  no palpable lymphadenopathy in the cervical, axillary or inguinal LUNGS: clear to auscultation and percussion with normal breathing effort HEART: regular rate & rhythm and no murmurs and no lower extremity edema ABDOMEN:abdomen soft, non-tender and  normal bowel sounds MUSCULOSKELETAL:no cyanosis of digits and no clubbing  NEURO: alert & oriented x 3 with fluent speech, no focal motor/sensory deficits EXTREMITIES: No lower extremity edema BREAST: No palpable masses or nodules in either right or left breasts. No palpable axillary supraclavicular or infraclavicular adenopathy no breast tenderness or nipple discharge. (exam performed in the presence of a chaperone)  LABORATORY DATA:  I have reviewed the data as listed CMP Latest Ref Rng & Units 02/10/2018 01/20/2018 12/30/2017  Glucose 70 - 99 mg/dL 90 99 91  BUN 6 - 20 mg/dL _0 Creatinine 0.44 - 1.00 mg/dL 0.76 0.76 0.68  Sodium 135 - 145 mmol/L 141 142 140  Potassium 3.5 - 5.1 mmol/L 4.3 4.1 4.0  Chloride 98 - 111 mmol/L 108 108 108  CO2 22 - 32 mmol/L _1 Calcium 8.9 - 10.3 mg/dL 9.4 9.2 8.6(L)  Total Protein 6.5 - 8.1 g/dL 7.0 7.0 6.9  Total Bilirubin 0.3 - 1.2 mg/dL 0.4 0.4 0.5  Alkaline Phos 38 - 126 U/L 87 85 91  AST 15 - 41 U/L _2 ALT 0 - 44 U/L _3 Lab Results  Component Value Date   WBC 5.1 03/03/2018   HGB 12.7 03/03/2018   HCT 39.2 03/03/2018   MCV 92.0 03/03/2018   PLT 259 03/03/2018   NEUTROABS 2.9 03/03/2018    ASSESSMENT & PLAN:  Malignant neoplasm of lower-inner quadrant of left breast in female, estrogen receptor positive ([place]) 04/04/2017:Left mastectomy: IDC grade 3, 3.8 cm, intermediate grade DCIS, invasive cancer comes 0.1 cm to posterior margin and less than 0.1 cm to anterior inferior margin at 0/13 lymph nodes, ER 100%, PR 0%, HER-2 positive ratio 2.08 T2 N0 stage IIb  Treatment plan: 1.Adjuvant chemotherapywithTHfollowed by Herceptin maintenance for 1 year. 2. Adjuvant antiestrogen therapy --------------------------------------------------------- Current treatment:Herceptinto be completed December 2019 Echo 11/17/2017: EF 60-65%   Herceptin toxicities: None Letrozole toxicities: Very mild hot flashes and  myalgias.  Breast cancer surveillance:Right breast mammogram will need to be scheduled  Return to clinic every 3 weeks for Herceptin every 6 weeks for follow-up with me   Orders Placed This Encounter  Procedures  . MM DIAG BREAST TOMO UNI RIGHT    Standing Status:   Future    Standing Expiration Date:   03/04/2019    Order Specific Question:   Reason for Exam (SYMPTOM  OR DIAGNOSIS REQUIRED)    Answer:   Left breast cancer treated with mastectomy, right breast mammogram annual    Order Specific Question:   Is the patient pregnant?    Answer:   No    Order Specific Question:   Preferred imaging location?    Answer:   [facility]   The patient has a good understanding of the overall plan. she agrees with it. she will call with any problems that may develop before the next visit here.   Harriette Ohara, MD 03/03/18

## 2018-03-03 NOTE — Telephone Encounter (Signed)
Gave patient avs and calendar.   °

## 2018-03-08 ENCOUNTER — Ambulatory Visit: Payer: Self-pay | Admitting: General Surgery

## 2018-03-24 ENCOUNTER — Inpatient Hospital Stay: Payer: BLUE CROSS/BLUE SHIELD

## 2018-03-24 ENCOUNTER — Inpatient Hospital Stay: Payer: BLUE CROSS/BLUE SHIELD | Attending: Hematology and Oncology

## 2018-03-24 VITALS — BP 173/84 | HR 67 | Temp 97.9°F | Resp 18

## 2018-03-24 DIAGNOSIS — Z170 Estrogen receptor positive status [ER+]: Secondary | ICD-10-CM | POA: Insufficient documentation

## 2018-03-24 DIAGNOSIS — Z79811 Long term (current) use of aromatase inhibitors: Secondary | ICD-10-CM | POA: Diagnosis not present

## 2018-03-24 DIAGNOSIS — C50312 Malignant neoplasm of lower-inner quadrant of left female breast: Secondary | ICD-10-CM

## 2018-03-24 DIAGNOSIS — Z9012 Acquired absence of left breast and nipple: Secondary | ICD-10-CM | POA: Insufficient documentation

## 2018-03-24 DIAGNOSIS — Z79899 Other long term (current) drug therapy: Secondary | ICD-10-CM | POA: Insufficient documentation

## 2018-03-24 DIAGNOSIS — Z5112 Encounter for antineoplastic immunotherapy: Secondary | ICD-10-CM | POA: Diagnosis not present

## 2018-03-24 DIAGNOSIS — Z95828 Presence of other vascular implants and grafts: Secondary | ICD-10-CM

## 2018-03-24 LAB — CBC WITH DIFFERENTIAL (CANCER CENTER ONLY)
Abs Immature Granulocytes: 0.01 10*3/uL (ref 0.00–0.07)
BASOS ABS: 0 10*3/uL (ref 0.0–0.1)
BASOS PCT: 1 %
EOS PCT: 2 %
Eosinophils Absolute: 0.1 10*3/uL (ref 0.0–0.5)
HCT: 38.1 % (ref 36.0–46.0)
Hemoglobin: 12.4 g/dL (ref 12.0–15.0)
Immature Granulocytes: 0 %
Lymphocytes Relative: 25 %
Lymphs Abs: 1.2 10*3/uL (ref 0.7–4.0)
MCH: 30 pg (ref 26.0–34.0)
MCHC: 32.5 g/dL (ref 30.0–36.0)
MCV: 92.3 fL (ref 80.0–100.0)
Monocytes Absolute: 0.6 10*3/uL (ref 0.1–1.0)
Monocytes Relative: 12 %
NRBC: 0 % (ref 0.0–0.2)
Neutro Abs: 2.8 10*3/uL (ref 1.7–7.7)
Neutrophils Relative %: 60 %
PLATELETS: 242 10*3/uL (ref 150–400)
RBC: 4.13 MIL/uL (ref 3.87–5.11)
RDW: 13.1 % (ref 11.5–15.5)
WBC: 4.7 10*3/uL (ref 4.0–10.5)

## 2018-03-24 LAB — CMP (CANCER CENTER ONLY)
ALT: 18 U/L (ref 0–44)
AST: 16 U/L (ref 15–41)
Albumin: 3.7 g/dL (ref 3.5–5.0)
Alkaline Phosphatase: 78 U/L (ref 38–126)
Anion gap: 10 (ref 5–15)
BILIRUBIN TOTAL: 0.4 mg/dL (ref 0.3–1.2)
BUN: 16 mg/dL (ref 6–20)
CO2: 23 mmol/L (ref 22–32)
Calcium: 9.3 mg/dL (ref 8.9–10.3)
Chloride: 109 mmol/L (ref 98–111)
Creatinine: 0.74 mg/dL (ref 0.44–1.00)
Glucose, Bld: 91 mg/dL (ref 70–99)
POTASSIUM: 4.2 mmol/L (ref 3.5–5.1)
Sodium: 142 mmol/L (ref 135–145)
TOTAL PROTEIN: 6.8 g/dL (ref 6.5–8.1)

## 2018-03-24 MED ORDER — ACETAMINOPHEN 325 MG PO TABS
650.0000 mg | ORAL_TABLET | Freq: Once | ORAL | Status: AC
  Administered 2018-03-24: 650 mg via ORAL

## 2018-03-24 MED ORDER — HEPARIN SOD (PORK) LOCK FLUSH 100 UNIT/ML IV SOLN
500.0000 [IU] | Freq: Once | INTRAVENOUS | Status: AC | PRN
  Administered 2018-03-24: 500 [IU]
  Filled 2018-03-24: qty 5

## 2018-03-24 MED ORDER — SODIUM CHLORIDE 0.9% FLUSH
10.0000 mL | INTRAVENOUS | Status: DC | PRN
  Administered 2018-03-24: 10 mL via INTRAVENOUS
  Filled 2018-03-24: qty 10

## 2018-03-24 MED ORDER — SODIUM CHLORIDE 0.9% FLUSH
10.0000 mL | INTRAVENOUS | Status: DC | PRN
  Administered 2018-03-24: 10 mL
  Filled 2018-03-24: qty 10

## 2018-03-24 MED ORDER — ACETAMINOPHEN 325 MG PO TABS
ORAL_TABLET | ORAL | Status: AC
  Filled 2018-03-24: qty 2

## 2018-03-24 MED ORDER — TRASTUZUMAB CHEMO 150 MG IV SOLR
6.0000 mg/kg | Freq: Once | INTRAVENOUS | Status: AC
  Administered 2018-03-24: 378 mg via INTRAVENOUS
  Filled 2018-03-24: qty 18

## 2018-03-24 MED ORDER — SODIUM CHLORIDE 0.9 % IV SOLN
Freq: Once | INTRAVENOUS | Status: AC
  Administered 2018-03-24: 09:00:00 via INTRAVENOUS
  Filled 2018-03-24: qty 250

## 2018-03-24 NOTE — Patient Instructions (Signed)
[facility] Discharge Instructions for Patients Receiving Chemotherapy  Today you received the following chemotherapy agents Herceptin  To help prevent nausea and vomiting after your treatment, we encourage you to take your nausea medication as directed   If you develop nausea and vomiting that is not controlled by your nausea medication, call the clinic.   BELOW ARE SYMPTOMS THAT SHOULD BE REPORTED IMMEDIATELY:  *FEVER GREATER THAN 100.5 F  *CHILLS WITH OR WITHOUT FEVER  NAUSEA AND VOMITING THAT IS NOT CONTROLLED WITH YOUR NAUSEA MEDICATION  *UNUSUAL SHORTNESS OF BREATH  *UNUSUAL BRUISING OR BLEEDING  TENDERNESS IN MOUTH AND THROAT WITH OR WITHOUT PRESENCE OF ULCERS  *URINARY PROBLEMS  *BOWEL PROBLEMS  UNUSUAL RASH Items with * indicate a potential emergency and should be followed up as soon as possible.  Feel free to call the clinic should you have any questions or concerns. The clinic phone number is [phone].  Please show the CHEMO ALERT CARD at check-in to the Emergency Department and triage nurse.   

## 2018-03-24 NOTE — Patient Instructions (Signed)

## 2018-04-14 ENCOUNTER — Inpatient Hospital Stay: Payer: BLUE CROSS/BLUE SHIELD

## 2018-04-14 ENCOUNTER — Telehealth: Payer: Self-pay | Admitting: Hematology and Oncology

## 2018-04-14 ENCOUNTER — Inpatient Hospital Stay (HOSPITAL_BASED_OUTPATIENT_CLINIC_OR_DEPARTMENT_OTHER): Payer: BLUE CROSS/BLUE SHIELD | Admitting: Hematology and Oncology

## 2018-04-14 DIAGNOSIS — C50312 Malignant neoplasm of lower-inner quadrant of left female breast: Secondary | ICD-10-CM

## 2018-04-14 DIAGNOSIS — Z170 Estrogen receptor positive status [ER+]: Principal | ICD-10-CM

## 2018-04-14 DIAGNOSIS — Z9012 Acquired absence of left breast and nipple: Secondary | ICD-10-CM | POA: Diagnosis not present

## 2018-04-14 DIAGNOSIS — Z79811 Long term (current) use of aromatase inhibitors: Secondary | ICD-10-CM

## 2018-04-14 LAB — CMP (CANCER CENTER ONLY)
ALT: 24 U/L (ref 0–44)
ANION GAP: 7 (ref 5–15)
AST: 25 U/L (ref 15–41)
Albumin: 4 g/dL (ref 3.5–5.0)
Alkaline Phosphatase: 82 U/L (ref 38–126)
BILIRUBIN TOTAL: 0.5 mg/dL (ref 0.3–1.2)
BUN: 17 mg/dL (ref 6–20)
CO2: 26 mmol/L (ref 22–32)
Calcium: 9.3 mg/dL (ref 8.9–10.3)
Chloride: 107 mmol/L (ref 98–111)
Creatinine: 0.76 mg/dL (ref 0.44–1.00)
GFR, Estimated: >60 mL/min (ref >60)
Glucose, Bld: 100 mg/dL — ABNORMAL HIGH (ref 70–99)
POTASSIUM: 4.1 mmol/L (ref 3.5–5.1)
Sodium: 140 mmol/L (ref 135–145)
TOTAL PROTEIN: 7 g/dL (ref 6.5–8.1)

## 2018-04-14 LAB — CBC WITH DIFFERENTIAL (CANCER CENTER ONLY)
Abs Immature Granulocytes: 0 10*3/uL (ref 0.00–0.07)
BASOS PCT: 1 %
Basophils Absolute: 0 10*3/uL (ref 0.0–0.1)
EOS ABS: 0.1 10*3/uL (ref 0.0–0.5)
EOS PCT: 1 %
HCT: 38.9 % (ref 36.0–46.0)
Hemoglobin: 12.6 g/dL (ref 12.0–15.0)
IMMATURE GRANULOCYTES: 0 %
Lymphocytes Relative: 27 %
Lymphs Abs: 1.6 10*3/uL (ref 0.7–4.0)
MCH: 29.9 pg (ref 26.0–34.0)
MCHC: 32.4 g/dL (ref 30.0–36.0)
MCV: 92.2 fL (ref 80.0–100.0)
Monocytes Absolute: 0.7 10*3/uL (ref 0.1–1.0)
Monocytes Relative: 12 %
NEUTROS PCT: 59 %
NRBC: 0 % (ref 0.0–0.2)
Neutro Abs: 3.5 10*3/uL (ref 1.7–7.7)
PLATELETS: 274 10*3/uL (ref 150–400)
RBC: 4.22 MIL/uL (ref 3.87–5.11)
RDW: 13 % (ref 11.5–15.5)
WBC Count: 5.8 10*3/uL (ref 4.0–10.5)

## 2018-04-14 MED ORDER — HEPARIN SOD (PORK) LOCK FLUSH 100 UNIT/ML IV SOLN
500.0000 [IU] | Freq: Once | INTRAVENOUS | Status: AC | PRN
  Administered 2018-04-14: 500 [IU]
  Filled 2018-04-14: qty 5

## 2018-04-14 MED ORDER — ACETAMINOPHEN 325 MG PO TABS
ORAL_TABLET | ORAL | Status: AC
  Filled 2018-04-14: qty 2

## 2018-04-14 MED ORDER — TRASTUZUMAB CHEMO 150 MG IV SOLR
6.0000 mg/kg | Freq: Once | INTRAVENOUS | Status: AC
  Administered 2018-04-14: 378 mg via INTRAVENOUS
  Filled 2018-04-14: qty 18

## 2018-04-14 MED ORDER — SODIUM CHLORIDE 0.9 % IV SOLN
Freq: Once | INTRAVENOUS | Status: AC
  Administered 2018-04-14: 10:00:00 via INTRAVENOUS
  Filled 2018-04-14: qty 250

## 2018-04-14 MED ORDER — ACETAMINOPHEN 325 MG PO TABS
650.0000 mg | ORAL_TABLET | Freq: Once | ORAL | Status: AC
  Administered 2018-04-14: 650 mg via ORAL

## 2018-04-14 MED ORDER — SODIUM CHLORIDE 0.9% FLUSH
10.0000 mL | INTRAVENOUS | Status: DC | PRN
  Administered 2018-04-14: 10 mL
  Filled 2018-04-14: qty 10

## 2018-04-14 NOTE — Progress Notes (Signed)
Patient Care Team: Molli Posey, MD as PCP - General (Obstetrics and Gynecology)  DIAGNOSIS:  Encounter Diagnosis  Name Primary?  . Malignant neoplasm of lower-inner quadrant of left breast in female, estrogen receptor positive ([place])     SUMMARY OF ONCOLOGIC HISTORY:   Malignant neoplasm of lower-inner quadrant of left breast in female, estrogen receptor positive ([place])   02/28/2017 Initial Diagnosis    Palpable left breast masses 2.5 cm at 7:00 and another 2.5 cm at 6:00 1.9 cm apart biopsy: IDC grade 2 with DCISER 100%, PR 0%, Ki-67 20%, HER-2 positiveratio 2.08, axilla negative; in addition indeterminate calcifications medially and laterally,T2 N0 stage 2A clinical stage AJCC 8    04/04/2017 Surgery    Left mastectomy: IDC grade 3, 3.8 cm, intermediate grade DCIS, invasive cancer comes 0.1 cm to posterior margin and less than 0.1 cm to anterior inferior margin at 0/13 lymph nodes, ER 100%, PR 0%, HER-2 positive ratio 2.08 T2 N0 stage IIb    05/20/2017 -  Chemotherapy    Taxol Herceptin weekly x12 followed by Herceptin maintenance every 3 weeks for 1 year (patient refused [place])      CHIEF COMPLIANT: Herceptin maintenance  INTERVAL HISTORY: Adriana Jones is a 62 year old with above-mentioned history of left breast cancer treated with mastectomy and is on adjuvant chemotherapy and on Herceptin maintenance.  She is tolerating Herceptin extremely well.  She is also on letrozole and appears to be tolerating that fairly well.  She had one day of cramps in her hands that lasted for a short..  She did not know if it was related to the antiestrogen therapy.  This happened after she raked a lot of leaves.  REVIEW OF SYSTEMS:   Constitutional: Denies fevers, chills or abnormal weight loss Eyes: Denies blurriness of vision Ears, nose, mouth, throat, and face: Denies mucositis or sore throat Respiratory: Denies cough, dyspnea or wheezes Cardiovascular: Denies palpitation, chest  discomfort Gastrointestinal:  Denies nausea, heartburn or change in bowel habits Skin: Denies abnormal skin rashes Lymphatics: Denies new lymphadenopathy or easy bruising Neurological:Denies numbness, tingling or new weaknesses Behavioral/Psych: Mood is stable, no new changes  Extremities: Cramps   All other systems were reviewed with the patient and are negative.  I have reviewed the past medical history, past surgical history, social history and family history with the patient and they are unchanged from previous note.  ALLERGIES:  is allergic to amoxicillin and sulfa antibiotics.  MEDICATIONS:  Current Outpatient Medications  Medication Sig Dispense Refill  . BORON PO Take 2 mg by mouth daily.    . Calcium Carbonate-Vit D-Min (CALCIUM 1200 PO) Take by mouth daily.    Marland Kitchen letrozole (FEMARA) 2.5 MG tablet Take 1 tablet (2.5 mg total) by mouth daily. 90 tablet 3  . Magnesium 100 MG CAPS Take by mouth daily.    Marland Kitchen OVER THE COUNTER MEDICATION Take 1,000 mg by mouth daily. [place]    . Vitamin D, Cholecalciferol, 1000 units TABS Take 6,000 Units by mouth daily.     No current facility-administered medications for this visit.     PHYSICAL EXAMINATION: ECOG PERFORMANCE STATUS: 1 - Symptomatic but completely ambulatory  Vitals:   04/14/18 0845  BP: (!) 145/82  Pulse: 78  Resp: 17  Temp: 97.9 F (36.6 C)  SpO2: 98%   Filed Weights   04/14/18 0845  Weight: 144 lb 14.4 oz (65.7 kg)    GENERAL:alert, no distress and comfortable SKIN: skin color, texture, turgor are normal, no  rashes or significant lesions EYES: normal, Conjunctiva are pink and non-injected, sclera clear OROPHARYNX:no exudate, no erythema and lips, buccal mucosa, and tongue normal  NECK: supple, thyroid normal size, non-tender, without nodularity LYMPH:  no palpable lymphadenopathy in the cervical, axillary or inguinal LUNGS: clear to auscultation and percussion with normal breathing effort HEART: regular rate &  rhythm and no murmurs and no lower extremity edema ABDOMEN:abdomen soft, non-tender and normal bowel sounds MUSCULOSKELETAL:no cyanosis of digits and no clubbing  NEURO: alert & oriented x 3 with fluent speech, no focal motor/sensory deficits EXTREMITIES: No lower extremity edema    LABORATORY DATA:  I have reviewed the data as listed CMP Latest Ref Rng & Units 03/24/2018 03/03/2018 02/10/2018  Glucose 70 - 99 mg/dL 91 100(H) 90  BUN 6 - 20 mg/dL _0 Creatinine 0.44 - 1.00 mg/dL 0.74 0.73 0.76  Sodium 135 - 145 mmol/L 142 141 141  Potassium 3.5 - 5.1 mmol/L 4.2 4.1 4.3  Chloride 98 - 111 mmol/L 109 106 108  CO2 22 - 32 mmol/L _1 Calcium 8.9 - 10.3 mg/dL 9.3 9.4 9.4  Total Protein 6.5 - 8.1 g/dL 6.8 7.3 7.0  Total Bilirubin 0.3 - 1.2 mg/dL 0.4 0.4 0.4  Alkaline Phos 38 - 126 U/L 78 79 87  AST 15 - 41 U/L _2 ALT 0 - 44 U/L _3 Lab Results  Component Value Date   WBC 5.8 04/14/2018   HGB 12.6 04/14/2018   HCT 38.9 04/14/2018   MCV 92.2 04/14/2018   PLT 274 04/14/2018   NEUTROABS 3.5 04/14/2018    ASSESSMENT & PLAN:  Malignant neoplasm of lower-inner quadrant of left breast in female, estrogen receptor positive ([place]) 04/04/2017:Left mastectomy: IDC grade 3, 3.8 cm, intermediate grade DCIS, invasive cancer comes 0.1 cm to posterior margin and less than 0.1 cm to anterior inferior margin at 0/13 lymph nodes, ER 100%, PR 0%, HER-2 positive ratio 2.08 T2 N0 stage IIb  Treatment plan: 1.Adjuvant chemotherapywithTHfollowed by Herceptin maintenance for 1 year. 2. Adjuvant antiestrogen therapy --------------------------------------------------------- Current treatment:Herceptinto be completed December 2019 Echo 11/17/2017: EF 60-65%  Herceptin toxicities: None Letrozole toxicities: Very mild hot flashes and myalgias. She had one day of severe cramps yesterday.  It may have been because she reacted a lot of leaves. I instructed her to call us if  she develops frequent symptoms so that we can change her medication if necessary.  Breast cancer surveillance:Right breast mammogram will need to be done in September 2020.  Return to clinic every 3 weeks for Herceptin every 6 weeks for follow-up withme    No orders of the defined types were placed in this encounter.  The patient has a good understanding of the overall plan. she agrees with it. she will call with any problems that may develop before the next visit here.   Harriette Ohara, MD 04/14/18

## 2018-04-14 NOTE — Telephone Encounter (Signed)
Patient decline avs and calendar °

## 2018-04-14 NOTE — Patient Instructions (Signed)
Implanted Port Home Guide An implanted port is a type of central line that is placed under the skin. Central lines are used to provide IV access when treatment or nutrition needs to be given through a person's veins. Implanted ports are used for long-term IV access. An implanted port may be placed because:  You need IV medicine that would be irritating to the small veins in your hands or arms.  You need long-term IV medicines, such as antibiotics.  You need IV nutrition for a long period.  You need frequent blood draws for lab tests.  You need dialysis.  Implanted ports are usually placed in the chest area, but they can also be placed in the upper arm, the abdomen, or the leg. An implanted port has two main parts:  Reservoir. The reservoir is round and will appear as a small, raised area under your skin. The reservoir is the part where a needle is inserted to give medicines or draw blood.  Catheter. The catheter is a thin, flexible tube that extends from the reservoir. The catheter is placed into a large vein. Medicine that is inserted into the reservoir goes into the catheter and then into the vein.  How will I care for my incision site? Do not get the incision site wet. Bathe or shower as directed by your health care provider. How is my port accessed? Special steps must be taken to access the port:  Before the port is accessed, a numbing cream can be placed on the skin. This helps numb the skin over the port site.  Your health care provider uses a sterile technique to access the port. ? Your health care provider must put on a mask and sterile gloves. ? The skin over your port is cleaned carefully with an antiseptic and allowed to dry. ? The port is gently pinched between sterile gloves, and a needle is inserted into the port.  Only "non-coring" port needles should be used to access the port. Once the port is accessed, a blood return should be checked. This helps ensure that the port  is in the vein and is not clogged.  If your port needs to remain accessed for a constant infusion, a clear (transparent) bandage will be placed over the needle site. The bandage and needle will need to be changed every week, or as directed by your health care provider.  Keep the bandage covering the needle clean and dry. Do not get it wet. Follow your health care provider's instructions on how to take a shower or bath while the port is accessed.  If your port does not need to stay accessed, no bandage is needed over the port.  What is flushing? Flushing helps keep the port from getting clogged. Follow your health care provider's instructions on how and when to flush the port. Ports are usually flushed with saline solution or a medicine called heparin. The need for flushing will depend on how the port is used.  If the port is used for intermittent medicines or blood draws, the port will need to be flushed: ? After medicines have been given. ? After blood has been drawn. ? As part of routine maintenance.  If a constant infusion is running, the port may not need to be flushed.  How long will my port stay implanted? The port can stay in for as long as your health care provider thinks it is needed. When it is time for the port to come out, surgery will be   done to remove it. The procedure is similar to the one performed when the port was put in. When should I seek immediate medical care? When you have an implanted port, you should seek immediate medical care if:  You notice a bad smell coming from the incision site.  You have swelling, redness, or drainage at the incision site.  You have more swelling or pain at the port site or the surrounding area.  You have a fever that is not controlled with medicine.  This information is not intended to replace advice given to you by your health care provider. Make sure you discuss any questions you have with your health care provider. Document  Released: 05/10/2005 Document Revised: 10/16/2015 Document Reviewed: 01/15/2013 [employer] Interactive Patient Education  2017 [employer]  

## 2018-04-14 NOTE — Assessment & Plan Note (Signed)
04/04/2017:Left mastectomy: IDC grade 3, 3.8 cm, intermediate grade DCIS, invasive cancer comes 0.1 cm to posterior margin and less than 0.1 cm to anterior inferior margin at 0/13 lymph nodes, ER 100%, PR 0%, HER-2 positive ratio 2.08 T2 N0 stage IIb  Treatment plan: 1.Adjuvant chemotherapywithTHfollowed by Herceptin maintenance for 1 year. 2. Adjuvant antiestrogen therapy --------------------------------------------------------- Current treatment:Herceptinto be completed December 2019 Echo 11/17/2017: EF 60-65%  Herceptin toxicities: None Letrozole toxicities: Very mild hot flashes and myalgias.  Breast cancer surveillance:Right breast mammogram has been ordered  Return to clinic every 3 weeks for Herceptin every 6 weeks for follow-up withme

## 2018-04-14 NOTE — Patient Instructions (Signed)
[facility] Discharge Instructions for Patients Receiving Chemotherapy  Today you received the following chemotherapy agents Herceptin  To help prevent nausea and vomiting after your treatment, we encourage you to take your nausea medication as directed   If you develop nausea and vomiting that is not controlled by your nausea medication, call the clinic.   BELOW ARE SYMPTOMS THAT SHOULD BE REPORTED IMMEDIATELY:  *FEVER GREATER THAN 100.5 F  *CHILLS WITH OR WITHOUT FEVER  NAUSEA AND VOMITING THAT IS NOT CONTROLLED WITH YOUR NAUSEA MEDICATION  *UNUSUAL SHORTNESS OF BREATH  *UNUSUAL BRUISING OR BLEEDING  TENDERNESS IN MOUTH AND THROAT WITH OR WITHOUT PRESENCE OF ULCERS  *URINARY PROBLEMS  *BOWEL PROBLEMS  UNUSUAL RASH Items with * indicate a potential emergency and should be followed up as soon as possible.  Feel free to call the clinic should you have any questions or concerns. The clinic phone number is [phone].  Please show the CHEMO ALERT CARD at check-in to the Emergency Department and triage nurse.   

## 2018-05-05 ENCOUNTER — Encounter: Payer: Self-pay | Admitting: *Deleted

## 2018-05-05 ENCOUNTER — Inpatient Hospital Stay: Payer: BLUE CROSS/BLUE SHIELD

## 2018-05-05 ENCOUNTER — Telehealth: Payer: Self-pay | Admitting: Adult Health

## 2018-05-05 ENCOUNTER — Inpatient Hospital Stay: Payer: BLUE CROSS/BLUE SHIELD | Attending: Hematology and Oncology

## 2018-05-05 VITALS — BP 153/90 | HR 88 | Temp 98.4°F | Resp 16

## 2018-05-05 DIAGNOSIS — C50312 Malignant neoplasm of lower-inner quadrant of left female breast: Secondary | ICD-10-CM | POA: Insufficient documentation

## 2018-05-05 DIAGNOSIS — Z5112 Encounter for antineoplastic immunotherapy: Secondary | ICD-10-CM | POA: Diagnosis not present

## 2018-05-05 DIAGNOSIS — Z95828 Presence of other vascular implants and grafts: Secondary | ICD-10-CM

## 2018-05-05 DIAGNOSIS — Z170 Estrogen receptor positive status [ER+]: Secondary | ICD-10-CM | POA: Insufficient documentation

## 2018-05-05 DIAGNOSIS — Z79899 Other long term (current) drug therapy: Secondary | ICD-10-CM | POA: Diagnosis not present

## 2018-05-05 LAB — CBC WITH DIFFERENTIAL (CANCER CENTER ONLY)
Abs Immature Granulocytes: 0.02 10*3/uL (ref 0.00–0.07)
BASOS ABS: 0 10*3/uL (ref 0.0–0.1)
Basophils Relative: 0 %
Eosinophils Absolute: 0.1 10*3/uL (ref 0.0–0.5)
Eosinophils Relative: 1 %
HCT: 36.9 % (ref 36.0–46.0)
Hemoglobin: 12 g/dL (ref 12.0–15.0)
Immature Granulocytes: 0 %
Lymphocytes Relative: 15 %
Lymphs Abs: 1.1 10*3/uL (ref 0.7–4.0)
MCH: 29.6 pg (ref 26.0–34.0)
MCHC: 32.5 g/dL (ref 30.0–36.0)
MCV: 91.1 fL (ref 80.0–100.0)
Monocytes Absolute: 0.7 10*3/uL (ref 0.1–1.0)
Monocytes Relative: 10 %
Neutro Abs: 5.2 10*3/uL (ref 1.7–7.7)
Neutrophils Relative %: 74 %
PLATELETS: 235 10*3/uL (ref 150–400)
RBC: 4.05 MIL/uL (ref 3.87–5.11)
RDW: 12.6 % (ref 11.5–15.5)
WBC: 7.1 10*3/uL (ref 4.0–10.5)
nRBC: 0 % (ref 0.0–0.2)

## 2018-05-05 LAB — CMP (CANCER CENTER ONLY)
ALT: 13 U/L (ref 0–44)
ANION GAP: 10 (ref 5–15)
AST: 13 U/L — ABNORMAL LOW (ref 15–41)
Albumin: 3.6 g/dL (ref 3.5–5.0)
Alkaline Phosphatase: 87 U/L (ref 38–126)
BUN: 15 mg/dL (ref 6–20)
CO2: 25 mmol/L (ref 22–32)
Calcium: 9.2 mg/dL (ref 8.9–10.3)
Chloride: 107 mmol/L (ref 98–111)
Creatinine: 0.74 mg/dL (ref 0.44–1.00)
GFR, Est AFR Am: >60 mL/min (ref >60)
GFR, Estimated: >60 mL/min (ref >60)
Glucose, Bld: 100 mg/dL — ABNORMAL HIGH (ref 70–99)
Potassium: 4.2 mmol/L (ref 3.5–5.1)
Sodium: 142 mmol/L (ref 135–145)
Total Bilirubin: 0.4 mg/dL (ref 0.3–1.2)
Total Protein: 6.8 g/dL (ref 6.5–8.1)

## 2018-05-05 MED ORDER — SODIUM CHLORIDE 0.9 % IV SOLN
Freq: Once | INTRAVENOUS | Status: AC
  Administered 2018-05-05: 09:00:00 via INTRAVENOUS
  Filled 2018-05-05: qty 250

## 2018-05-05 MED ORDER — HEPARIN SOD (PORK) LOCK FLUSH 100 UNIT/ML IV SOLN
500.0000 [IU] | Freq: Once | INTRAVENOUS | Status: AC | PRN
  Administered 2018-05-05: 500 [IU]
  Filled 2018-05-05: qty 5

## 2018-05-05 MED ORDER — ACETAMINOPHEN 325 MG PO TABS
ORAL_TABLET | ORAL | Status: AC
  Filled 2018-05-05: qty 2

## 2018-05-05 MED ORDER — SODIUM CHLORIDE 0.9% FLUSH
10.0000 mL | INTRAVENOUS | Status: DC | PRN
  Administered 2018-05-05: 10 mL
  Filled 2018-05-05: qty 10

## 2018-05-05 MED ORDER — TRASTUZUMAB CHEMO 150 MG IV SOLR
6.0000 mg/kg | Freq: Once | INTRAVENOUS | Status: AC
  Administered 2018-05-05: 378 mg via INTRAVENOUS
  Filled 2018-05-05: qty 18

## 2018-05-05 MED ORDER — ACETAMINOPHEN 325 MG PO TABS
650.0000 mg | ORAL_TABLET | Freq: Once | ORAL | Status: AC
  Administered 2018-05-05: 650 mg via ORAL

## 2018-05-05 MED ORDER — SODIUM CHLORIDE 0.9% FLUSH
10.0000 mL | INTRAVENOUS | Status: DC | PRN
  Administered 2018-05-05: 10 mL via INTRAVENOUS
  Filled 2018-05-05: qty 10

## 2018-05-05 NOTE — Telephone Encounter (Signed)
Scheduled appt per 12/13 sch message - sent reminder letter in the mail with appt date and time.  

## 2018-05-05 NOTE — Patient Instructions (Signed)
[facility] Discharge Instructions for Patients Receiving Chemotherapy  Today you received the following chemotherapy agents Herceptin  To help prevent nausea and vomiting after your treatment, we encourage you to take your nausea medication as directed   If you develop nausea and vomiting that is not controlled by your nausea medication, call the clinic.   BELOW ARE SYMPTOMS THAT SHOULD BE REPORTED IMMEDIATELY:  *FEVER GREATER THAN 100.5 F  *CHILLS WITH OR WITHOUT FEVER  NAUSEA AND VOMITING THAT IS NOT CONTROLLED WITH YOUR NAUSEA MEDICATION  *UNUSUAL SHORTNESS OF BREATH  *UNUSUAL BRUISING OR BLEEDING  TENDERNESS IN MOUTH AND THROAT WITH OR WITHOUT PRESENCE OF ULCERS  *URINARY PROBLEMS  *BOWEL PROBLEMS  UNUSUAL RASH Items with * indicate a potential emergency and should be followed up as soon as possible.  Feel free to call the clinic should you have any questions or concerns. The clinic phone number is [phone].  Please show the CHEMO ALERT CARD at check-in to the Emergency Department and triage nurse.   

## 2018-05-19 ENCOUNTER — Ambulatory Visit (HOSPITAL_COMMUNITY)
Admission: RE | Admit: 2018-05-19 | Discharge: 2018-05-19 | Disposition: A | Discharge status: Home / Self Care | Payer: BLUE CROSS/BLUE SHIELD | Source: Ambulatory Visit | Attending: Internal Medicine | Admitting: Internal Medicine

## 2018-05-19 ENCOUNTER — Encounter (HOSPITAL_COMMUNITY): Payer: Self-pay | Admitting: Internal Medicine

## 2018-05-19 ENCOUNTER — Ambulatory Visit (HOSPITAL_BASED_OUTPATIENT_CLINIC_OR_DEPARTMENT_OTHER)
Admission: RE | Admit: 2018-05-19 | Discharge: 2018-05-19 | Disposition: A | Discharge status: Home / Self Care | Payer: BLUE CROSS/BLUE SHIELD | Source: Ambulatory Visit | Attending: Internal Medicine | Admitting: Internal Medicine

## 2018-05-19 ENCOUNTER — Other Ambulatory Visit: Payer: Self-pay

## 2018-05-19 VITALS — BP 144/90 | HR 90 | Wt 146.5 lb

## 2018-05-19 DIAGNOSIS — Z9221 Personal history of antineoplastic chemotherapy: Secondary | ICD-10-CM | POA: Insufficient documentation

## 2018-05-19 DIAGNOSIS — C50312 Malignant neoplasm of lower-inner quadrant of left female breast: Secondary | ICD-10-CM | POA: Diagnosis not present

## 2018-05-19 DIAGNOSIS — Z882 Allergy status to sulfonamides status: Secondary | ICD-10-CM | POA: Insufficient documentation

## 2018-05-19 DIAGNOSIS — Z170 Estrogen receptor positive status [ER+]: Secondary | ICD-10-CM

## 2018-05-19 DIAGNOSIS — Z87891 Personal history of nicotine dependence: Secondary | ICD-10-CM | POA: Diagnosis not present

## 2018-05-19 DIAGNOSIS — F419 Anxiety disorder, unspecified: Secondary | ICD-10-CM | POA: Diagnosis not present

## 2018-05-19 DIAGNOSIS — I10 Essential (primary) hypertension: Secondary | ICD-10-CM | POA: Diagnosis not present

## 2018-05-19 DIAGNOSIS — Z79899 Other long term (current) drug therapy: Secondary | ICD-10-CM | POA: Diagnosis not present

## 2018-05-19 DIAGNOSIS — Z880 Allergy status to penicillin: Secondary | ICD-10-CM | POA: Diagnosis not present

## 2018-05-19 NOTE — Progress Notes (Signed)
Cardio-Oncology Clinic Note   Referring Physician: Dr. Lindi Adie Primary Cardiologist: New (Dr. Haroldine Laws)  HPI: Adriana Jones is a 62 y.o. female with past medical history of Left Breast CA who has been referred by Dr. Lindi Adie  to establish in the cardio-oncology clinic for monitoring of cardio-toxicty while undergoing chemotherapy.  Cancer Profile  Malignant neoplasm of lower-inner quadrant of left breast in female, estrogen receptor positive ([place]) 02/28/2017:Palpable left breast masses 2.5 cm at 7:00 and another 2.5 cm at 6:00 1.9 cm apart biopsy: IDC grade 2 with DCIS ER 100%, PR 0%, Ki-67 20%, HER-2 positiveratio 2.08, axilla negative; in addition indeterminate calcifications medially and laterally,T2 N0 stage 2A clinical stage AJCC 8     Malignant neoplasm of lower-inner quadrant of left breast in female, estrogen receptor positive ([place])   02/28/2017 Initial Diagnosis    Palpable left breast masses 2.5 cm at 7:00 and another 2.5 cm at 6:00 1.9 cm apart biopsy: IDC grade 2 with DCISER 100%, PR 0%, Ki-67 20%, HER-2 positiveratio 2.08, axilla negative; in addition indeterminate calcifications medially and laterally,T2 N0 stage 2A clinical stage AJCC 8      04/04/2017 Surgery    Left mastectomy: IDC grade 3, 3.8 cm, intermediate grade DCIS, invasive cancer comes 0.1 cm to posterior margin and less than 0.1 cm to anterior inferior margin at 0/13 lymph nodes, ER 100%, PR 0%, HER-2 positive ratio 2.08 T2 N0 stage IIb      05/20/2017 -  Chemotherapy    Taxol Herceptin weekly x12 followed by Herceptin maintenance every 3 weeks for 1 year (patient refused [facility])       She presents today for regular follow up. Doing well overall. BP at home runs from 110-130s on average, as high as 170 at times when she is irritated. She is completely finished with Herceptin as of 3 weeks. No SOB, orthopnea, or edema. Continues to walk her dogs without issues. 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 No dizziness, CP, or palpitations.   No family history of coronary disease.   Echo today shows 60-65%, Mild AI, Mild TR, -17.7 (but underestimated)  Echo 02/15/18 EF 60-65% GLS -14.3% (underestimated)  Echo 11/17/17 EF 60-65% GLS -21.5% Personally reviewed Echo 3/19  EF 60-65% GLS -22.2 LS 9.1  Echo 11/18 - EF 60-65% GLS -22.9 Lateral S'10.6   Review of systems complete and found to be negative unless listed in HPI.    Past Medical History:  Diagnosis Date  . Anxiety   . Cancer [facility])    breast  . Headache     Current Outpatient Medications  Medication Sig Dispense Refill  . BORON PO Take 2 mg by mouth daily.    . Calcium Carbonate-Vit D-Min (CALCIUM 1200 PO) Take by mouth daily.    Marland Kitchen letrozole (FEMARA) 2.5 MG tablet Take 1 tablet (2.5 mg total) by mouth daily. 90 tablet 3  . Magnesium 100 MG CAPS Take by mouth daily.    Marland Kitchen OVER THE COUNTER MEDICATION Take 1,000 mg by mouth daily. [place]    . Vitamin D, Cholecalciferol, 1000 units TABS Take 6,000 Units by mouth daily.     No current facility-administered medications for this encounter.     Allergies  Allergen Reactions  . Amoxicillin Hives    Has patient had a PCN reaction causing immediate rash, facial/tongue/throat swelling, SOB or lightheadedness with hypotension: No Has patient had a PCN reaction causing severe rash involving mucus membranes or skin  necrosis: No Has patient had a PCN reaction that required hospitalization: No Has patient had a PCN reaction occurring within the last 10 years: Yes If all of the above answers are "NO", then may proceed with Cephalosporin use.   . Sulfa Antibiotics Hives      Social History   Socioeconomic History  . Marital status: Married    Spouse name: Not on file  . Number of children: Not on file  . Years of education: Not on file  . Highest education level: Not on file  Occupational History  . Not on file  Social Needs  . Financial resource  strain: Not on file  . Food insecurity:    Worry: Not on file    Inability: Not on file  . Transportation needs:    Medical: Not on file    Non-medical: Not on file  Tobacco Use  . Smoking status: Former Smoker    Years: 3.00    Types: E-cigarettes    Start date: 08/07/2013  . Smokeless tobacco: Never Used  Substance and Sexual Activity  . Alcohol use: Yes    Comment: 4-10/wk  . Drug use: No  . Sexual activity: Not on file  Lifestyle  . Physical activity:    Days per week: Not on file    Minutes per session: Not on file  . Stress: Not on file  Relationships  . Social connections:    Talks on phone: Not on file    Gets together: Not on file    Attends religious service: Not on file    Active member of club or organization: Not on file    Attends meetings of clubs or organizations: Not on file    Relationship status: Not on file  . Intimate partner violence:    Fear of current or ex partner: Not on file    Emotionally abused: Not on file    Physically abused: Not on file    Forced sexual activity: Not on file  Other Topics Concern  . Not on file  Social History Narrative  . Not on file   FHx: No family history of coronary disease.   Vitals:   05/19/18 1050  BP: (!) 144/90  Pulse: 90  SpO2: 99%  Weight: 66.5 kg (146 lb 8 oz)   Wt Readings from Last 3 Encounters:  05/19/18 66.5 kg (146 lb 8 oz)  04/14/18 65.7 kg (144 lb 14.4 oz)  03/03/18 66 kg (145 lb 6.4 oz)     PHYSICAL EXAM: General:  Well appearing. No resp difficulty HEENT: normal Neck: supple. no JVD. Carotids 2+ bilat; no bruits. No lymphadenopathy or thryomegaly appreciated. Cor: PMI nondisplaced. Regular rate & rhythm. No rubs, gallops or murmurs. Lungs: clear Abdomen: soft, nontender, nondistended. No hepatosplenomegaly. No bruits or masses. Good bowel sounds. Extremities: no cyanosis, clubbing, rash, edema Neuro: alert & orientedx3, cranial nerves grossly intact. moves all 4 extremities w/o  difficulty. Affect pleasant   ASSESSMENT & PLAN:  1. Breast CA of Lower-Inner quadrant of left breast in female, Estrogen Receptor + - Echo today shows EF 55-60%. She has finished Herceptin.  - Can follow up prn.  2. HTN - BP elevated at times.  - We placed 24-hour BP monitor on 9/10. Results reviewed. Overall BP 137/77 which is fine. However BP does go up significantly with emotion stress with peak BPs 195/128. - At this point no indication to start anti-HTN but does need to focus on stress reduction. She will keep  BP log at home and we will review in 3 months. Can repeat 24-hour monitor as needed  Annamaria Helling 05/19/18 10:53 AM   Patient seen and examined with the above-signed Advanced Practice Provider and/or Housestaff. I personally reviewed laboratory data, imaging studies and relevant notes. I independently examined the patient and formulated the important aspects of the plan. I have edited the note to reflect any of my changes or salient points. I have personally discussed the plan with the patient and/or family.  She is doing very well. Has completed Herceptin. Echo reviewed personally and all parameters stable. Ambulatory BP results shared with her. BP well controlled except in stressful situations. We discussed need for stress reduction.   Can f/u with [place] on PRN basis.   Glori Bickers, MD  11:38 AM

## 2018-05-19 NOTE — Patient Instructions (Signed)
Follow up as needed

## 2018-05-19 NOTE — Progress Notes (Signed)
  Echocardiogram 2D Echocardiogram has been performed.  Adriana Jones 05/19/2018, 10:47 AM

## 2018-08-31 ENCOUNTER — Telehealth: Payer: Self-pay | Admitting: Adult Health

## 2018-08-31 NOTE — Telephone Encounter (Signed)
Moved 5/1 to 4/29 SCP Webex visit per 4/8 sch msg

## 2018-09-18 ENCOUNTER — Telehealth: Payer: Self-pay | Admitting: Adult Health

## 2018-09-18 NOTE — Telephone Encounter (Signed)
Called regarding upcoming Webex appointment, patient would like this appointment to be cancelled and due to personal reasons this patient will not be rescheduling.

## 2018-09-20 ENCOUNTER — Inpatient Hospital Stay: Payer: BLUE CROSS/BLUE SHIELD | Admitting: Adult Health

## 2018-09-22 ENCOUNTER — Encounter: Payer: BLUE CROSS/BLUE SHIELD | Admitting: Adult Health

## 2018-10-09 NOTE — Assessment & Plan Note (Signed)
04/04/2017:Left mastectomy: IDC grade 3, 3.8 cm, intermediate grade DCIS, invasive cancer comes 0.1 cm to posterior margin and less than 0.1 cm to anterior inferior margin at 0/13 lymph nodes, ER 100%, PR 0%, HER-2 positive ratio 2.08 T2 N0 stage IIb  Treatment plan: 1.Adjuvant chemotherapywithTHfollowed by Herceptin maintenance for 1 year completed December 2019. 2. Adjuvant antiestrogen therapy with letrozole --------------------------------------------------------- Current treatment:Letrozole started  Letrozole toxicities: Very mild hot flashes and myalgias.  Breast cancer surveillance:Right breast mammogram will need to be done in September 2020.  Return to clinic in 1 year for follow-up.

## 2018-10-12 ENCOUNTER — Telehealth: Payer: Self-pay | Admitting: Hematology and Oncology

## 2018-10-12 NOTE — Progress Notes (Signed)
HEMATOLOGY-ONCOLOGY DOXIMITY VISIT PROGRESS NOTE  I connected with Adriana Jones on 10/13/2018 at  8:30 AM EDT by Doximity video conference and verified that I am speaking with the correct person using two identifiers.  I discussed the limitations, risks, security and privacy concerns of performing an evaluation and management service by Doximity and the availability of in person appointments.  I also discussed with the patient that there may be a patient responsible charge related to this service. The patient expressed understanding and agreed to proceed.  Patient's Location: Home Physician Location: Clinic  CHIEF COMPLIANT: Follow-up of letrozole   INTERVAL HISTORY: Adriana Jones is a 63 y.o. female with above-mentioned history of left breast cancer treated with mastectomy, adjuvant chemotherapy, and Herceptin maintenance who is currently on letrozole. I last saw her 6 months ago. ECHO on 05/19/18 showed an ejection fraction in the range of 60-65%. She presents today over Doximity for annual follow-up.  Brittle nails and mild neuropathy   Malignant neoplasm of lower-inner quadrant of left breast in female, estrogen receptor positive ([place])   02/28/2017 Initial Diagnosis    Palpable left breast masses 2.5 cm at 7:00 and another 2.5 cm at 6:00 1.9 cm apart biopsy: IDC grade 2 with DCISER 100%, PR 0%, Ki-67 20%, HER-2 positiveratio 2.08, axilla negative; in addition indeterminate calcifications medially and laterally,T2 N0 stage 2A clinical stage AJCC 8    04/04/2017 Surgery    Left mastectomy: IDC grade 3, 3.8 cm, intermediate grade DCIS, invasive cancer comes 0.1 cm to posterior margin and less than 0.1 cm to anterior inferior margin at 0/13 lymph nodes, ER 100%, PR 0%, HER-2 positive ratio 2.08 T2 N0 stage IIb    05/20/2017 - 05/05/2018 Chemotherapy    Taxol (completed Taxol portion 08/05/17) Herceptin weekly x12 followed by Herceptin maintenance every 3 weeks for 1 year (patient refused [facility])      12/2017 -  Anti-estrogen oral therapy    Letrozole daily     REVIEW OF SYSTEMS:   Constitutional: Denies fevers, chills or abnormal weight loss Eyes: Denies blurriness of vision Ears, nose, mouth, throat, and face: Denies mucositis or sore throat Respiratory: Denies cough, dyspnea or wheezes Cardiovascular: Denies palpitation, chest discomfort Gastrointestinal:  Denies nausea, heartburn or change in bowel habits Skin: Denies abnormal skin rashes Lymphatics: Denies new lymphadenopathy or easy bruising Neurological:Denies numbness, tingling or new weaknesses Behavioral/Psych: Mood is stable, no new changes  Extremities: No lower extremity edema Breast: denies any pain or lumps or nodules in either breasts All other systems were reviewed with the patient and are negative.  Observations/Objective:   I have reviewed the data as listed CMP Latest Ref Rng & Units 05/05/2018 04/14/2018 03/24/2018  Glucose 70 - 99 mg/dL 100(H) 100(H) 91  BUN 6 - 20 mg/dL '15 17 16  '$ Creatinine 0.44 - 1.00 mg/dL 0.74 0.76 0.74  Sodium 135 - 145 mmol/L 142 140 142  Potassium 3.5 - 5.1 mmol/L 4.2 4.1 4.2  Chloride 98 - 111 mmol/L 107 107 109  CO2 22 - 32 mmol/L '25 26 23  '$ Calcium 8.9 - 10.3 mg/dL 9.2 9.3 9.3  Total Protein 6.5 - 8.1 g/dL 6.8 7.0 6.8  Total Bilirubin 0.3 - 1.2 mg/dL 0.4 0.5 0.4  Alkaline Phos 38 - 126 U/L 87 82 78  AST 15 - 41 U/L 13(L) 25 16  ALT 0 - 44 U/L '13 24 18    '$ Lab Results  Component Value Date   WBC 7.1 05/05/2018   HGB  12.0 05/05/2018   HCT 36.9 05/05/2018   MCV 91.1 05/05/2018   PLT 235 05/05/2018   NEUTROABS 5.2 05/05/2018      Assessment Plan:  Malignant neoplasm of lower-inner quadrant of left breast in female, estrogen receptor positive ([place]) 04/04/2017:Left mastectomy: IDC grade 3, 3.8 cm, intermediate grade DCIS, invasive cancer comes 0.1 cm to posterior margin and less than 0.1 cm to anterior inferior margin at 0/13 lymph nodes, ER 100%, PR 0%, HER-2 positive  ratio 2.08 T2 N0 stage IIb  Treatment plan: 1.Adjuvant chemotherapywithTHfollowed by Herceptin maintenance for 1 year completed December 2019. 2. Adjuvant antiestrogen therapy with letrozole --------------------------------------------------------- Current treatment:Letrozole started 12/2017 Brittle nails Calcium causes headaches Difficulty sleeping due to Vit D Takes mushrooms of all kinds (even feeding her Dog) Mild Neuropathy: Exercising  Letrozole toxicities: Very mild hot flashes and myalgias. (much improved)  Breast cancer surveillance:Right breast mammogram will need to be done in September 2020.  Return to clinic in 1 year for follow-up.  I discussed the assessment and treatment plan with the patient. The patient was provided an opportunity to ask questions and all were answered. The patient agreed with the plan and demonstrated an understanding of the instructions. The patient was advised to call back or seek an in-person evaluation if the symptoms worsen or if the condition fails to improve as anticipated.   I provided 15 minutes of face-to-face Doximity time during this encounter.    Rulon Eisenmenger, MD 10/13/2018   I, Molly Dorshimer, am acting as scribe for Nicholas Lose, MD.  I have reviewed the above documentation for accuracy and completeness, and I agree with the above.

## 2018-10-12 NOTE — Telephone Encounter (Signed)
Called patient regarding upcoming Webex appointment, patient would prefer this to be a Doximity call.

## 2018-10-13 ENCOUNTER — Inpatient Hospital Stay: Payer: BLUE CROSS/BLUE SHIELD | Attending: Hematology and Oncology | Admitting: Hematology and Oncology

## 2018-10-13 DIAGNOSIS — Z9221 Personal history of antineoplastic chemotherapy: Secondary | ICD-10-CM | POA: Diagnosis not present

## 2018-10-13 DIAGNOSIS — C50312 Malignant neoplasm of lower-inner quadrant of left female breast: Secondary | ICD-10-CM | POA: Diagnosis not present

## 2018-10-13 DIAGNOSIS — Z79811 Long term (current) use of aromatase inhibitors: Secondary | ICD-10-CM

## 2018-10-13 DIAGNOSIS — Z170 Estrogen receptor positive status [ER+]: Secondary | ICD-10-CM

## 2018-10-13 DIAGNOSIS — Z9012 Acquired absence of left breast and nipple: Secondary | ICD-10-CM

## 2018-10-13 MED ORDER — LETROZOLE 2.5 MG PO TABS: 2.5000 mg | ORAL_TABLET | Freq: Every day | ORAL | 3 refills | Status: DC

## 2019-10-11 NOTE — Progress Notes (Signed)
Patient Care Team: Molli Posey, MD as PCP - General (Obstetrics and Gynecology) Nicholas Lose, MD as Consulting Physician (Hematology and Oncology) Jovita Kussmaul, MD as Consulting Physician (General Surgery) Bensimhon, Shaune Pascal, MD as Consulting Physician (Cardiology)  DIAGNOSIS:    ICD-10-CM   1. Malignant neoplasm of lower-inner quadrant of left breast in female, estrogen receptor positive ([place])  C50.312 MM DIAG BREAST TOMO UNI RIGHT   Z17.0     SUMMARY OF ONCOLOGIC HISTORY: Oncology History  Malignant neoplasm of lower-inner quadrant of left breast in female, estrogen receptor positive ([place])  02/28/2017 Initial Diagnosis   Palpable left breast masses 2.5 cm at 7:00 and another 2.5 cm at 6:00 1.9 cm apart biopsy: IDC grade 2 with DCISER 100%, PR 0%, Ki-67 20%, HER-2 positiveratio 2.08, axilla negative; in addition indeterminate calcifications medially and laterally,T2 N0 stage 2A clinical stage AJCC 8   04/04/2017 Surgery   Left mastectomy: IDC grade 3, 3.8 cm, intermediate grade DCIS, invasive cancer comes 0.1 cm to posterior margin and less than 0.1 cm to anterior inferior margin at 0/13 lymph nodes, ER 100%, PR 0%, HER-2 positive ratio 2.08 T2 N0 stage IIb   05/20/2017 - 05/05/2018 Chemotherapy   Taxol (completed Taxol portion 08/05/17) Herceptin weekly x12 followed by Herceptin maintenance every 3 weeks for 1 year (patient refused [facility])    12/2017 -  Anti-estrogen oral therapy   Letrozole daily     CHIEF COMPLIANT: Follow-up of left breast cancer on letrozole   INTERVAL HISTORY: Adriana Jones is a 64 y.o. with above-mentioned history of left breast cancer treated with mastectomy, adjuvant chemotherapy, and Herceptin maintenance who is currently on letrozole. She presents to the clinic today for annual follow-up.   She has not had a mammogram because she has been busy taking care of her husband who cannot walk.  She denies any pain lumps or nodules in the breast.  She does  have muscle aches and pains from letrozole.  ALLERGIES:  is allergic to amoxicillin and sulfa antibiotics.  MEDICATIONS:  Current Outpatient Medications  Medication Sig Dispense Refill  . BORON PO Take 2 mg by mouth daily.    . Calcium Carbonate-Vit D-Min (CALCIUM 1200 PO) Take by mouth daily.    Marland Kitchen letrozole (FEMARA) 2.5 MG tablet Take 1 tablet (2.5 mg total) by mouth daily. 90 tablet 3  . Magnesium 100 MG CAPS Take by mouth daily.    Marland Kitchen OVER THE COUNTER MEDICATION Take 1,000 mg by mouth daily. [place]    . Vitamin D, Cholecalciferol, 1000 units TABS Take 6,000 Units by mouth daily.     No current facility-administered medications for this visit.    PHYSICAL EXAMINATION: ECOG PERFORMANCE STATUS: 1 - Symptomatic but completely ambulatory  Vitals:   10/12/19 0943  BP: (!) 191/91  Pulse: 75  Resp: 18  Temp: 98.7 F (37.1 C)  SpO2: 100%   Filed Weights   10/12/19 0943  Weight: 135 lb 4.8 oz (61.4 kg)    BREAST: No palpable lumps or nodules in the right breast.  Left chest wall scar is intact without any lumps or nodules.  (exam performed in the presence of a chaperone)  LABORATORY DATA:  I have reviewed the data as listed CMP Latest Ref Rng & Units 05/05/2018 04/14/2018 03/24/2018  Glucose 70 - 99 mg/dL 100(H) 100(H) 91  BUN 6 - 20 mg/dL '15 17 16  '$ Creatinine 0.44 - 1.00 mg/dL 0.74 0.76 0.74  Sodium 135 - 145 mmol/L 142  140 142  Potassium 3.5 - 5.1 mmol/L 4.2 4.1 4.2  Chloride 98 - 111 mmol/L 107 107 109  CO2 22 - 32 mmol/L '25 26 23  '$ Calcium 8.9 - 10.3 mg/dL 9.2 9.3 9.3  Total Protein 6.5 - 8.1 g/dL 6.8 7.0 6.8  Total Bilirubin 0.3 - 1.2 mg/dL 0.4 0.5 0.4  Alkaline Phos 38 - 126 U/L 87 82 78  AST 15 - 41 U/L 13(L) 25 16  ALT 0 - 44 U/L '13 24 18    '$ Lab Results  Component Value Date   WBC 7.1 05/05/2018   HGB 12.0 05/05/2018   HCT 36.9 05/05/2018   MCV 91.1 05/05/2018   PLT 235 05/05/2018   NEUTROABS 5.2 05/05/2018    ASSESSMENT & PLAN:  Malignant neoplasm of  lower-inner quadrant of left breast in female, estrogen receptor positive ([place]) 04/04/2017:Left mastectomy: IDC grade 3, 3.8 cm, intermediate grade DCIS, invasive cancer comes 0.1 cm to posterior margin and less than 0.1 cm to anterior inferior margin at 0/13 lymph nodes, ER 100%, PR 0%, HER-2 positive ratio 2.08 T2 N0 stage IIb  Treatment plan: 1.Adjuvant chemotherapywithTHfollowed by Herceptin maintenance for 1 year completed December 2019. 2. Adjuvant antiestrogen therapy with letrozole --------------------------------------------------------- Current treatment:Letrozole started 12/2017 Brittle nails Muscle aches and pains  Takes mushrooms of all kinds   Mild Neuropathy: Exercising  Letrozole toxicities: Very mild hot flashes and myalgias. (much improved)  Breast cancer surveillance: Right breast mammogramwill need to be done Breast exam 10/12/2019: Benign  Patient takes care of her husband who is unable to walk. Return to clinic in 1 year for follow-up.     Orders Placed This Encounter  Procedures  . MM DIAG BREAST TOMO UNI RIGHT    Standing Status:   Future    Standing Expiration Date:   10/11/2020    Order Specific Question:   Reason for Exam (SYMPTOM  OR DIAGNOSIS REQUIRED)    Answer:   Annual mammogram    Order Specific Question:   Preferred imaging location?    Answer:   [facility]   The patient has a good understanding of the overall plan. she agrees with it. she will call with any problems that may develop before the next visit here.  Total time spent: 20 mins including face to face time and time spent for planning, charting and coordination of care  Nicholas Lose, MD 10/12/2019  I, Cloyde Reams Dorshimer, am acting as scribe for Dr. Nicholas Lose.  I have reviewed the above documentation for accuracy and completeness, and I agree with the above.

## 2019-10-12 ENCOUNTER — Other Ambulatory Visit: Payer: Self-pay

## 2019-10-12 ENCOUNTER — Inpatient Hospital Stay: Payer: 59 | Attending: Hematology and Oncology | Admitting: Hematology and Oncology

## 2019-10-12 DIAGNOSIS — Z79811 Long term (current) use of aromatase inhibitors: Secondary | ICD-10-CM | POA: Diagnosis not present

## 2019-10-12 DIAGNOSIS — Z170 Estrogen receptor positive status [ER+]: Secondary | ICD-10-CM | POA: Diagnosis not present

## 2019-10-12 DIAGNOSIS — C50312 Malignant neoplasm of lower-inner quadrant of left female breast: Secondary | ICD-10-CM

## 2019-10-12 DIAGNOSIS — Z9012 Acquired absence of left breast and nipple: Secondary | ICD-10-CM | POA: Diagnosis not present

## 2019-10-12 MED ORDER — LETROZOLE 2.5 MG PO TABS: 2.5000 mg | ORAL_TABLET | Freq: Every day | ORAL | 3 refills | Status: DC

## 2019-10-12 NOTE — Assessment & Plan Note (Signed)
04/04/2017:Left mastectomy: IDC grade 3, 3.8 cm, intermediate grade DCIS, invasive cancer comes 0.1 cm to posterior margin and less than 0.1 cm to anterior inferior margin at 0/13 lymph nodes, ER 100%, PR 0%, HER-2 positive ratio 2.08 T2 N0 stage IIb  Treatment plan: 1.Adjuvant chemotherapywithTHfollowed by Herceptin maintenance for 1 year completed December 2019. 2. Adjuvant antiestrogen therapy with letrozole --------------------------------------------------------- Current treatment:Letrozole started 12/2017 Brittle nails  Takes mushrooms of all kinds (even feeding her Dog) Mild Neuropathy: Exercising  Letrozole toxicities: Very mild hot flashes and myalgias. (much improved)  Breast cancer surveillance: Right breast mammogramwill need to be done in September 2020. Breast exam 10/12/2019: Benign  Return to clinic in 1 year for follow-up.

## 2019-10-16 ENCOUNTER — Telehealth: Payer: Self-pay | Admitting: *Deleted

## 2019-10-16 NOTE — Telephone Encounter (Signed)
Received call from pt with complaint of hypertension.  Pt states BP at home is [id number] to [id number] systolic and believes it is related to letrozole.  Per MD pt to stop taking letrozole x2 weeks and monitor and record BP three times a day (morning, afternoon, evening) to look for trends. RN encouraged pt to call the office sooner if blood pressure continues to be elevated or if pt experiences dizziness and headache.  Pt educated and verbalized understanding and states she will call the office in 2 weeks with an update.

## 2019-10-19 ENCOUNTER — Telehealth: Payer: Self-pay | Admitting: Hematology and Oncology

## 2019-10-19 NOTE — Telephone Encounter (Signed)
Scheduled per 05/21 los, patient has been called and notified. 

## 2019-11-05 ENCOUNTER — Telehealth: Payer: Self-pay | Admitting: *Deleted

## 2019-11-05 NOTE — Telephone Encounter (Signed)
Received VM from pt.  Attempt x1 to return call, no answer, LVM to return call to the office.  

## 2019-11-05 NOTE — Telephone Encounter (Signed)
Received call from pt stating she has stopped letrozole x2 weeks and systolic BP has come down to the 140's.  Pt states her home life is still extremely stressful and is not sure if the HTN is r/t home life stressors or the letrozole.  Per MD pt to start taking letrozole every other night x1 week and then increase to nightly and to continue to monitor BP two times a day.  Pt verbalized understanding and states she will call the office in 2 weeks with an update on her BP.

## 2019-11-07 ENCOUNTER — Other Ambulatory Visit: Payer: Self-pay | Admitting: *Deleted

## 2019-11-07 MED ORDER — LETROZOLE 2.5 MG PO TABS: 2.5000 mg | ORAL_TABLET | Freq: Every day | ORAL | 3 refills | Status: DC

## 2019-11-15 ENCOUNTER — Other Ambulatory Visit: Payer: Self-pay

## 2019-11-15 ENCOUNTER — Ambulatory Visit
Admission: RE | Admit: 2019-11-15 | Discharge: 2019-11-15 | Disposition: A | Discharge status: Home / Self Care | Payer: 59 | Source: Ambulatory Visit | Attending: Hematology and Oncology | Admitting: Hematology and Oncology

## 2019-11-15 DIAGNOSIS — C50312 Malignant neoplasm of lower-inner quadrant of left female breast: Secondary | ICD-10-CM

## 2019-11-19 ENCOUNTER — Telehealth: Payer: Self-pay | Admitting: *Deleted

## 2019-11-19 NOTE — Telephone Encounter (Signed)
Received call from pt stating she re started letrozole x2 weeks and her blood pressure has slowly been increasing.  Pt states the last two nights her BP was 638'G - 665'L systolic.  Per MD pt to stop letrozole and begin tamoxifen 20 mg.  Per MD pt to take half a tablet of letrozole daily and monitor blood pressure.  If blood pressure begins to increase, MD advised pt to take 1/2 a tablet every other night. Pt verbalized understanding and will keep the office updated.

## 2019-11-30 ENCOUNTER — Ambulatory Visit: Payer: 59

## 2020-10-09 NOTE — Progress Notes (Signed)
Patient Care Team: Molli Posey, MD as PCP - General (Obstetrics and Gynecology) Nicholas Lose, MD as Consulting Physician (Hematology and Oncology) Jovita Kussmaul, MD as Consulting Physician (General Surgery) Bensimhon, Shaune Pascal, MD as Consulting Physician (Cardiology)  DIAGNOSIS:    ICD-10-CM   1. Malignant neoplasm of lower-inner quadrant of left breast in female, estrogen receptor positive ([place])  C50.312    Z17.0     SUMMARY OF ONCOLOGIC HISTORY: Oncology History  Malignant neoplasm of lower-inner quadrant of left breast in female, estrogen receptor positive ([place])  02/28/2017 Initial Diagnosis   Palpable left breast masses 2.5 cm at 7:00 and another 2.5 cm at 6:00 1.9 cm apart biopsy: IDC grade 2 with DCISER 100%, PR 0%, Ki-67 20%, HER-2 positiveratio 2.08, axilla negative; in addition indeterminate calcifications medially and laterally,T2 N0 stage 2A clinical stage AJCC 8   04/04/2017 Surgery   Left mastectomy: IDC grade 3, 3.8 cm, intermediate grade DCIS, invasive cancer comes 0.1 cm to posterior margin and less than 0.1 cm to anterior inferior margin at 0/13 lymph nodes, ER 100%, PR 0%, HER-2 positive ratio 2.08 T2 N0 stage IIb   05/20/2017 - 05/05/2018 Chemotherapy   Taxol (completed Taxol portion 08/05/17) Herceptin weekly x12 followed by Herceptin maintenance every 3 weeks for 1 year (patient refused [facility])    12/2017 -  Anti-estrogen oral therapy   Letrozole daily     CHIEF COMPLIANT: Follow-up of left breast cancer on letrozole  INTERVAL HISTORY: Adriana Jones is a 65 y.o. with above-mentioned history of left breast cancer treated with mastectomy, adjuvant chemotherapy, and Herceptin maintenance who is currently on letrozole. Mammogram on 11/20/19 showed no evidence of malignancy in the right breast. She presents to the clinic today for annual follow-up. She is able to tolerate half a tablet of letrozole daily.  She denies any major muscle aches and pains.  Denies  any lumps or nodules in the breast.  ALLERGIES:  is allergic to amoxicillin and sulfa antibiotics.  MEDICATIONS:  Current Outpatient Medications  Medication Sig Dispense Refill  . BORON PO Take 2 mg by mouth daily.    . Calcium Carbonate-Vit D-Min (CALCIUM 1200 PO) Take by mouth daily.    Marland Kitchen letrozole (FEMARA) 2.5 MG tablet Take 1 tablet (2.5 mg total) by mouth daily. 90 tablet 3  . Magnesium 100 MG CAPS Take by mouth daily.    Marland Kitchen OVER THE COUNTER MEDICATION Take 1,000 mg by mouth daily. [place]    . Vitamin D, Cholecalciferol, 1000 units TABS Take 6,000 Units by mouth daily.     No current facility-administered medications for this visit.    PHYSICAL EXAMINATION: ECOG PERFORMANCE STATUS: 1 - Symptomatic but completely ambulatory  Vitals:   10/10/20 1049  BP: (!) 189/90  Pulse: 88  Resp: 18  Temp: 97.8 F (36.6 C)  SpO2: 100%   Filed Weights   10/10/20 1049  Weight: 140 lb 3.2 oz (63.6 kg)    BREAST: No palpable masses or nodules in either right or left breasts. No palpable axillary supraclavicular or infraclavicular adenopathy no breast tenderness or nipple discharge. (exam performed in the presence of a chaperone)  LABORATORY DATA:  I have reviewed the data as listed CMP Latest Ref Rng & Units 05/05/2018 04/14/2018 03/24/2018  Glucose 70 - 99 mg/dL 100(H) 100(H) 91  BUN 6 - 20 mg/dL $Remove'15 17 16  'dEWTJNN$ Creatinine 0.44 - 1.00 mg/dL 0.74 0.76 0.74  Sodium 135 - 145 mmol/L 142 140 142  Potassium 3.5 -  5.1 mmol/L 4.2 4.1 4.2  Chloride 98 - 111 mmol/L 107 107 109  CO2 22 - 32 mmol/L $RemoveB'25 26 23  'VOFaaLBO$ Calcium 8.9 - 10.3 mg/dL 9.2 9.3 9.3  Total Protein 6.5 - 8.1 g/dL 6.8 7.0 6.8  Total Bilirubin 0.3 - 1.2 mg/dL 0.4 0.5 0.4  Alkaline Phos 38 - 126 U/L 87 82 78  AST 15 - 41 U/L 13(L) 25 16  ALT 0 - 44 U/L $Remo'13 24 18    'loZcX$ Lab Results  Component Value Date   WBC 7.1 05/05/2018   HGB 12.0 05/05/2018   HCT 36.9 05/05/2018   MCV 91.1 05/05/2018   PLT 235 05/05/2018   NEUTROABS 5.2 05/05/2018     ASSESSMENT & PLAN:  Malignant neoplasm of lower-inner quadrant of left breast in female, estrogen receptor positive ([place]) 04/04/2017:Left mastectomy: IDC grade 3, 3.8 cm, intermediate grade DCIS, invasive cancer comes 0.1 cm to posterior margin and less than 0.1 cm to anterior inferior margin at 0/13 lymph nodes, ER 100%, PR 0%, HER-2 positive ratio 2.08 T2 N0 stage IIb  Treatment plan: 1.Adjuvant chemotherapywithTHfollowed by Herceptin maintenance for 1 yearcompleted December 2019. 2. Adjuvant antiestrogen therapywith letrozole --------------------------------------------------------- Current treatment:Letrozole started8/2019 currently taking half a tablet daily Muscle aches and pains Hypertension  Takes mushrooms of all kinds   Mild Neuropathy: Exercising  Letrozole toxicities: Very mild hot flashes and myalgias.(much improved)  Breast cancer surveillance: Right breast mammogram6/29/21: Benign Breast exam 10/12/2019: Benign  Patient takes care of her husband who was unable to walk.  He is not able to walk without a walker and this is helping her somewhat. Return to clinicin 1 year for follow-up.    No orders of the defined types were placed in this encounter.  The patient has a good understanding of the overall plan. she agrees with it. she will call with any problems that may develop before the next visit here.  Total time spent: 20 mins including face to face time and time spent for planning, charting and coordination of care  Rulon Eisenmenger, MD, MPH 10/10/2020  I, Molly Dorshimer, am acting as scribe for Dr. Nicholas Lose.  I have reviewed the above documentation for accuracy and completeness, and I agree with the above.

## 2020-10-09 NOTE — Assessment & Plan Note (Signed)
04/04/2017:Left mastectomy: IDC grade 3, 3.8 cm, intermediate grade DCIS, invasive cancer comes 0.1 cm to posterior margin and less than 0.1 cm to anterior inferior margin at 0/13 lymph nodes, ER 100%, PR 0%, HER-2 positive ratio 2.08 T2 N0 stage IIb  Treatment plan: 1.Adjuvant chemotherapywithTHfollowed by Herceptin maintenance for 1 yearcompleted December 2019. 2. Adjuvant antiestrogen therapywith letrozole --------------------------------------------------------- Current treatment:Letrozole started8/2019 Brittle nails Muscle aches and pains  Takes mushrooms of all kinds   Mild Neuropathy: Exercising  Letrozole toxicities: Very mild hot flashes and myalgias.(much improved)  Breast cancer surveillance: Right breast mammogram6/29/21 Breast exam 10/12/2019: Benign  Patient takes care of her husband who is unable to walk. Return to clinicin 1 year for follow-up.

## 2020-10-10 ENCOUNTER — Other Ambulatory Visit: Payer: Self-pay

## 2020-10-10 ENCOUNTER — Telehealth: Payer: Self-pay | Admitting: Hematology and Oncology

## 2020-10-10 ENCOUNTER — Inpatient Hospital Stay: Payer: 59 | Attending: Hematology and Oncology | Admitting: Hematology and Oncology

## 2020-10-10 DIAGNOSIS — M791 Myalgia, unspecified site: Secondary | ICD-10-CM | POA: Diagnosis not present

## 2020-10-10 DIAGNOSIS — Z9012 Acquired absence of left breast and nipple: Secondary | ICD-10-CM | POA: Diagnosis not present

## 2020-10-10 DIAGNOSIS — C50312 Malignant neoplasm of lower-inner quadrant of left female breast: Secondary | ICD-10-CM | POA: Diagnosis not present

## 2020-10-10 DIAGNOSIS — Z170 Estrogen receptor positive status [ER+]: Secondary | ICD-10-CM | POA: Insufficient documentation

## 2020-10-10 DIAGNOSIS — I10 Essential (primary) hypertension: Secondary | ICD-10-CM | POA: Diagnosis not present

## 2020-10-10 DIAGNOSIS — N951 Menopausal and female climacteric states: Secondary | ICD-10-CM | POA: Diagnosis not present

## 2020-10-10 DIAGNOSIS — G629 Polyneuropathy, unspecified: Secondary | ICD-10-CM | POA: Diagnosis not present

## 2020-10-10 DIAGNOSIS — Z79811 Long term (current) use of aromatase inhibitors: Secondary | ICD-10-CM | POA: Insufficient documentation

## 2020-10-10 MED ORDER — ZINC GLUCONATE 50 MG PO TABS
50.0000 mg | ORAL_TABLET | Freq: Every day | ORAL | Status: AC
Start: 2020-10-10 — End: ?

## 2020-10-10 NOTE — Telephone Encounter (Signed)
Scheduled appointment per 05/19 los. Patient is aware. 

## 2020-11-11 ENCOUNTER — Other Ambulatory Visit: Payer: Self-pay | Admitting: Hematology and Oncology

## 2021-03-12 IMAGING — MG DIGITAL SCREENING UNILAT RIGHT W/ TOMO W/ CAD
4 series · 4 of 12 positions shown · non-contrast
Comparison: Previous exam(s).

CLINICAL DATA: Screening.

EXAM:
DIGITAL SCREENING UNILATERAL RIGHT MAMMOGRAM WITH CAD AND TOMO

[R CC synth-2D]
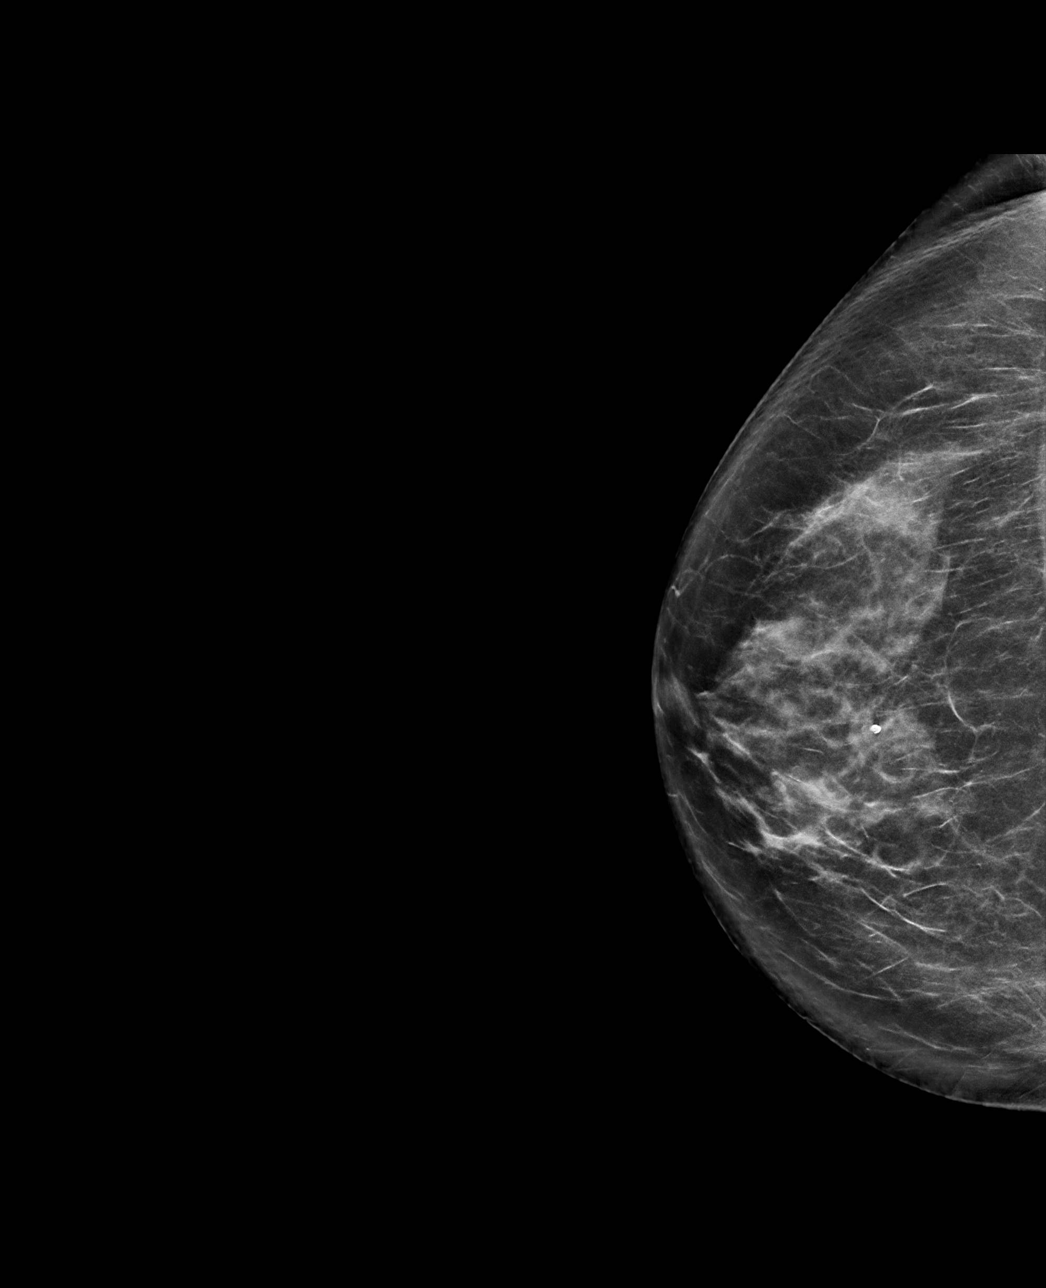

[R MLO synth-2D]
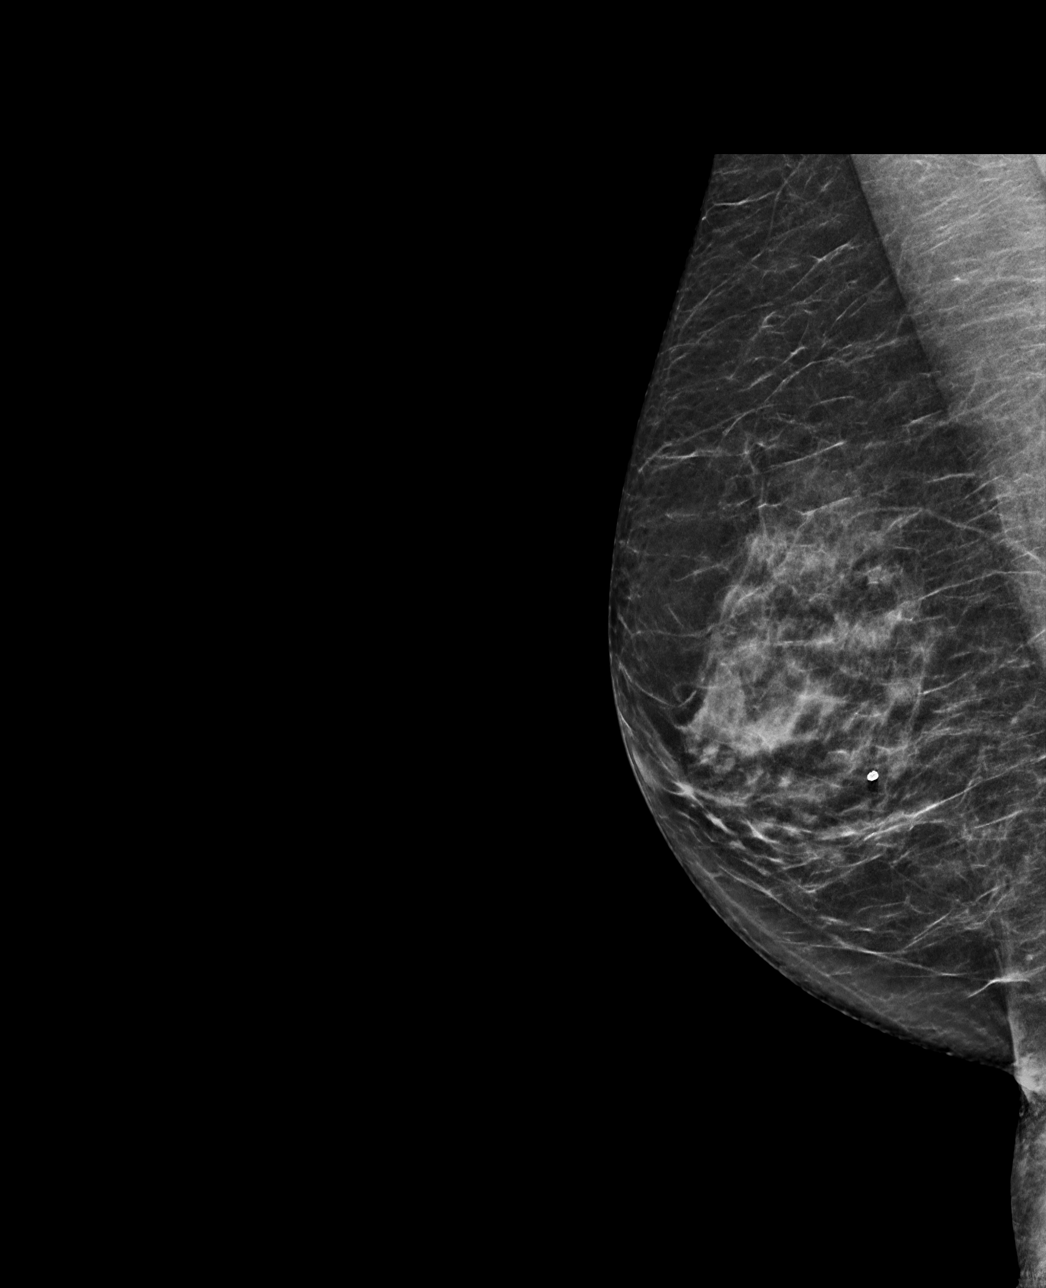

[R MLO tomo · tomo slice 33/65.0]
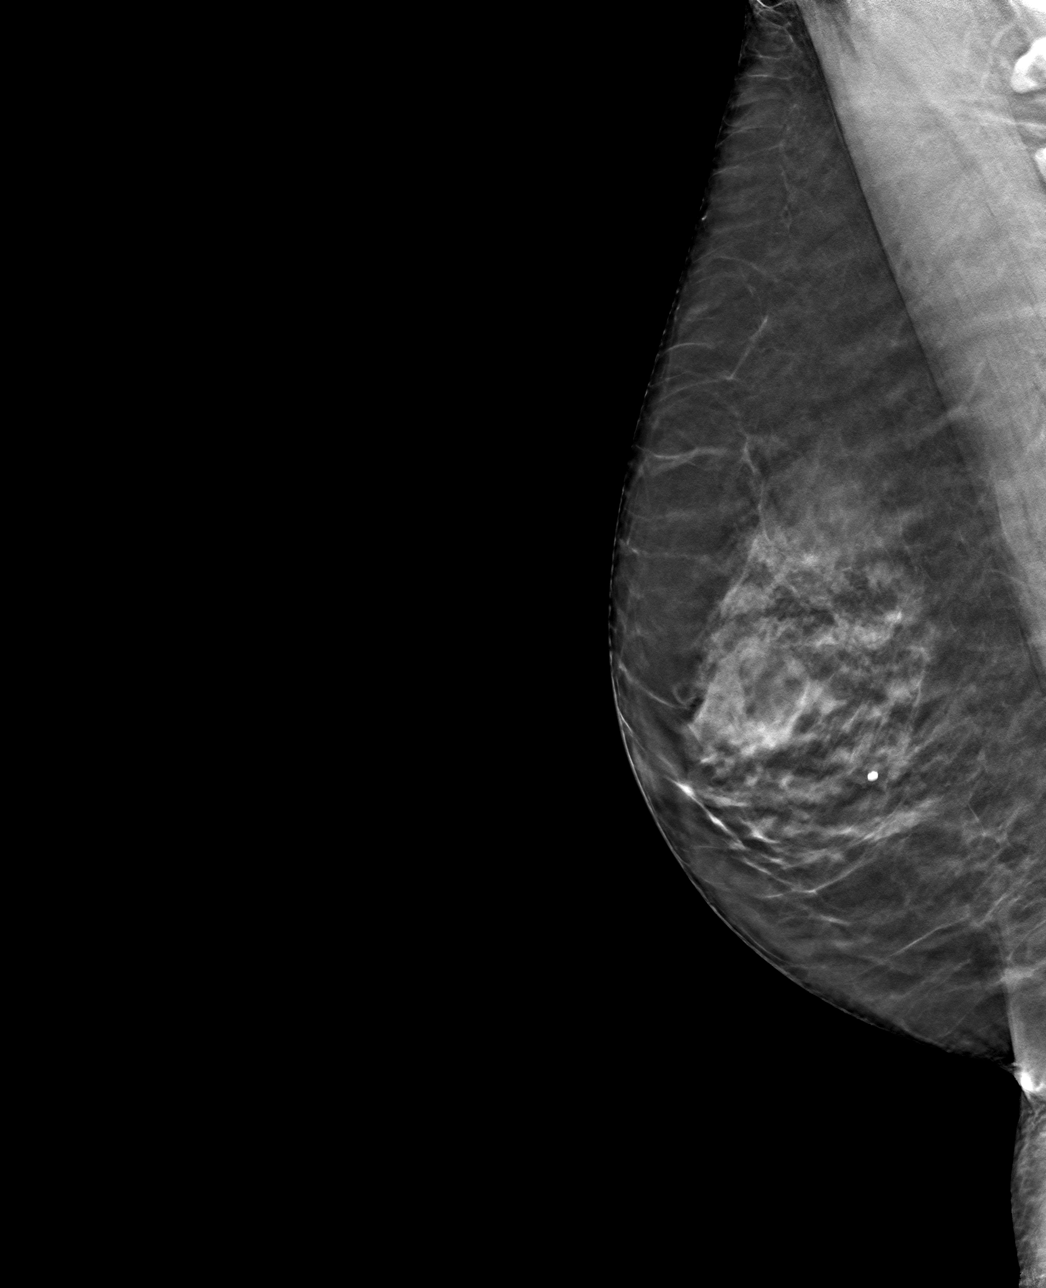

[R CC tomo · tomo slice 38/75.0]
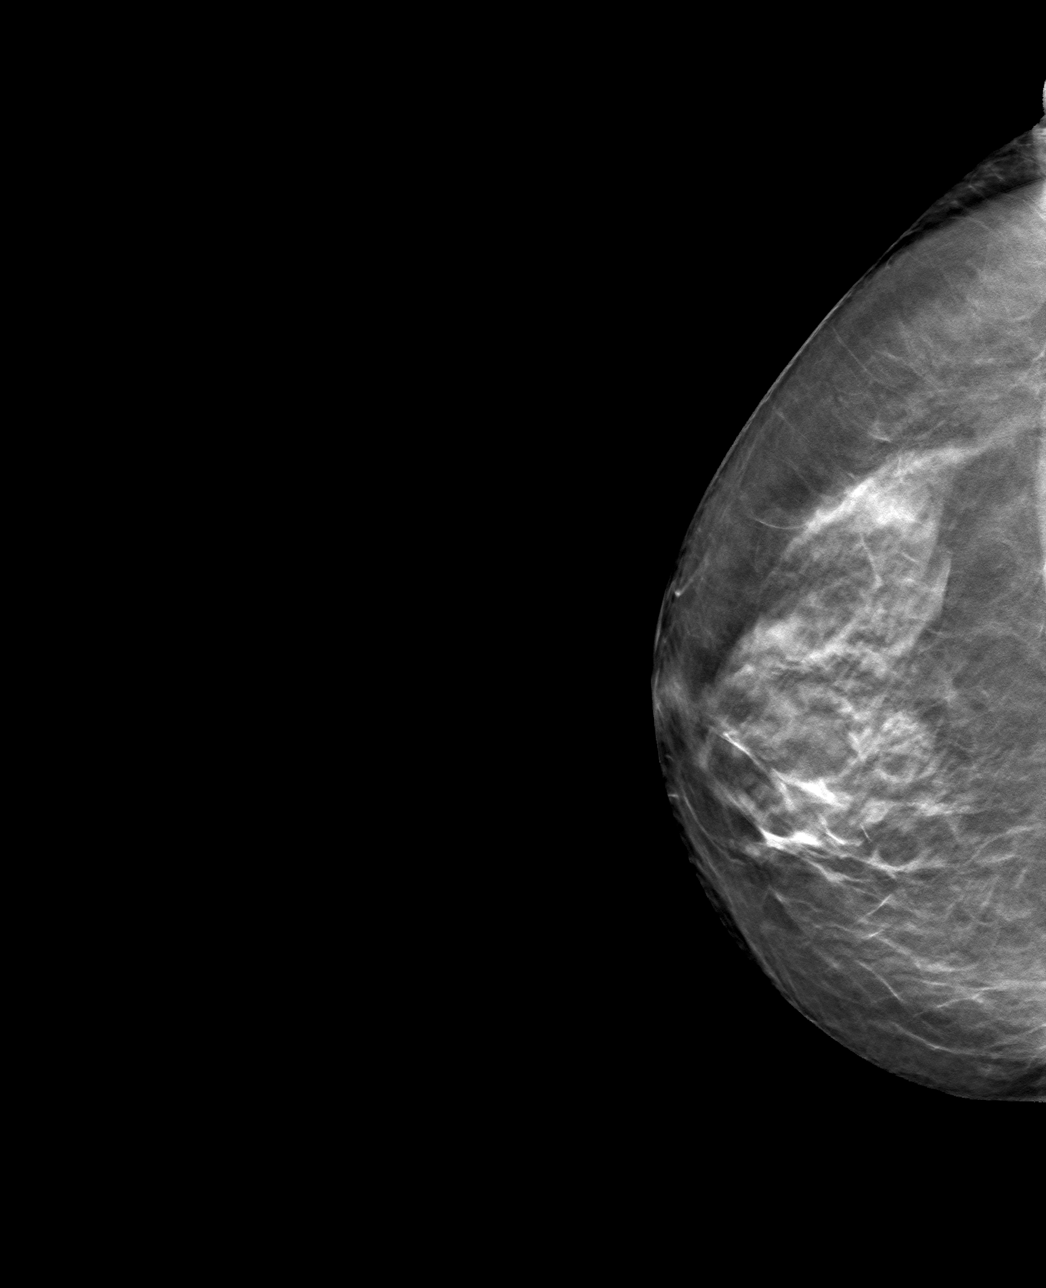

[4 of 12 positions shown; findings below may reference images not displayed]

ACR Breast Density Category c: The breast tissue is heterogeneously
dense, which may obscure small masses.
FINDINGS: The patient has had a left mastectomy. There are no findings
suspicious for malignancy. Images were processed with CAD.
IMPRESSION: No mammographic evidence of malignancy. A result letter of this
screening mammogram will be mailed directly to the patient.

RECOMMENDATION:
Screening mammogram in one year.  (Code:[id number])

BI-RADS CATEGORY  1: Negative.

## 2021-09-24 ENCOUNTER — Telehealth: Payer: Self-pay | Admitting: Hematology and Oncology

## 2021-09-24 NOTE — Telephone Encounter (Signed)
Rescheduled appointment per providers. Patient aware.  ? ?

## 2021-10-09 ENCOUNTER — Ambulatory Visit: Payer: 59 | Admitting: Hematology and Oncology

## 2021-10-09 NOTE — Progress Notes (Signed)
Patient Care Team: Molli Posey, MD as PCP - General (Obstetrics and Gynecology) Nicholas Lose, MD as Consulting Physician (Hematology and Oncology) Jovita Kussmaul, MD as Consulting Physician (General Surgery) Bensimhon, Shaune Pascal, MD as Consulting Physician (Cardiology)  DIAGNOSIS:  Encounter Diagnosis  Name Primary?   Malignant neoplasm of lower-inner quadrant of left breast in female, estrogen receptor positive ([place]) Yes    SUMMARY OF ONCOLOGIC HISTORY: Oncology History  Malignant neoplasm of lower-inner quadrant of left breast in female, estrogen receptor positive ([place])  02/28/2017 Initial Diagnosis   Palpable left breast masses 2.5 cm at 7:00 and another 2.5 cm at 6:00 1.9 cm apart biopsy: IDC grade 2 with DCISER 100%, PR 0%, Ki-67 20%, HER-2 positiveratio 2.08, axilla negative; in addition indeterminate calcifications medially and laterally,T2 N0 stage 2A clinical stage AJCC 8    04/04/2017 Surgery   Left mastectomy: IDC grade 3, 3.8 cm, intermediate grade DCIS, invasive cancer comes 0.1 cm to posterior margin and less than 0.1 cm to anterior inferior margin at 0/13 lymph nodes, ER 100%, PR 0%, HER-2 positive ratio 2.08 T2 N0 stage IIb    05/20/2017 - 05/05/2018 Chemotherapy   Taxol (completed Taxol portion 08/05/17) Herceptin weekly x12 followed by Herceptin maintenance every 3 weeks for 1 year (patient refused [facility])     12/2017 -  Anti-estrogen oral therapy   Letrozole daily      CHIEF COMPLIANT:  Follow-up of left breast cancer on letrozole   INTERVAL HISTORY: Adriana Jones is a 66 y.o. with above-mentioned of left breast cancer. She presents to the clinic today for a annual follow-up. She states that she has some growth on her face closer to her nose. Denies pain and discomfort in her breast. States that she don't feel good.   ALLERGIES:  is allergic to amoxicillin and sulfa antibiotics.  MEDICATIONS:  Current Outpatient Medications  Medication Sig Dispense  Refill   BORON PO Take 2 mg by mouth daily.     Calcium Carbonate-Vit D-Min (CALCIUM 1200 PO) Take by mouth daily.     letrozole (FEMARA) 2.5 MG tablet TAKE 1 TABLET(2.5 MG) BY MOUTH DAILY 90 tablet 3   Magnesium 100 MG CAPS Take by mouth daily.     OVER THE COUNTER MEDICATION Take 1,000 mg by mouth daily. AHCC     Vitamin D, Cholecalciferol, 1000 units TABS Take 6,000 Units by mouth daily.     zinc gluconate 50 MG tablet Take 1 tablet (50 mg total) by mouth daily.     No current facility-administered medications for this visit.    PHYSICAL EXAMINATION: ECOG PERFORMANCE STATUS: 1 - Symptomatic but completely ambulatory  Vitals:   10/16/21 0801  BP: (!) 191/81  Pulse: 78  Resp: 18  Temp: 98 F (36.7 C)  SpO2: 100%   Filed Weights   10/16/21 0801  Weight: 134 lb (60.8 kg)    BREAST: No palpable lumps or nodules in the right breast.  Left chest wall is without any lumps or nodules.  (exam performed in the presence of a chaperone)  LABORATORY DATA:  I have reviewed the data as listed    Latest Ref Rng & Units 05/05/2018    8:52 AM 04/14/2018    8:15 AM 03/24/2018    8:04 AM  CMP  Glucose 70 - 99 mg/dL 100   100   91    BUN 6 - 20 mg/dL $Remove'15   17   16    'AgrWyzx$ Creatinine 0.44 - 1.00  mg/dL 0.74   0.76   0.74    Sodium 135 - 145 mmol/L 142   140   142    Potassium 3.5 - 5.1 mmol/L 4.2   4.1   4.2    Chloride 98 - 111 mmol/L 107   107   109    CO2 22 - 32 mmol/L $RemoveB'25   26   23    'ebjaHamy$ Calcium 8.9 - 10.3 mg/dL 9.2   9.3   9.3    Total Protein 6.5 - 8.1 g/dL 6.8   7.0   6.8    Total Bilirubin 0.3 - 1.2 mg/dL 0.4   0.5   0.4    Alkaline Phos 38 - 126 U/L 87   82   78    AST 15 - 41 U/L $Remo'13   25   16    'GXGWN$ ALT 0 - 44 U/L $Remo'13   24   18      'LNUuu$ Lab Results  Component Value Date   WBC 7.1 05/05/2018   HGB 12.0 05/05/2018   HCT 36.9 05/05/2018   MCV 91.1 05/05/2018   PLT 235 05/05/2018   NEUTROABS 5.2 05/05/2018    ASSESSMENT & PLAN:  Malignant neoplasm of lower-inner quadrant of left breast  in female, estrogen receptor positive ([place]) 04/04/2017: Left mastectomy: IDC grade 3, 3.8 cm, intermediate grade DCIS, invasive cancer comes 0.1 cm to posterior margin and less than 0.1 cm to anterior inferior margin at 0/13 lymph nodes, ER 100%, PR 0%, HER-2 positive ratio 2.08 T2 N0 stage IIb   Treatment plan: 1. Adjuvant chemotherapy with TH followed by Herceptin maintenance for 1 year completed December 2019. 2. Adjuvant antiestrogen therapy with letrozole --------------------------------------------------------- Current treatment: Letrozole started 12/2017 currently taking half a tablet daily Muscle aches and pains Hypertension   Takes mushrooms of all kinds    Mild Neuropathy: Exercising   Letrozole toxicities: Very mild hot flashes and myalgias.  She takes letrozole every other day.   Breast cancer surveillance: Right breast mammogram 11/20/19: Benign Breast exam 10/16/2021: Benign She needs a new mammogram and I sent a new order to breast center.  She will call and get that scheduled.   Patient takes care of her husband who was unable to walk.    The stress of taking care of him and her mother have been weighing heavily on her.  She overall just does not feel good.  Skin lesions on the face: Consult dermatology  Return to clinic in 1 year for follow-up.    Orders Placed This Encounter  Procedures   MM DIAG BREAST TOMO UNI RIGHT    Standing Status:   Future    Standing Expiration Date:   10/17/2022    Order Specific Question:   Reason for Exam (SYMPTOM  OR DIAGNOSIS REQUIRED)    Answer:   annual mammogram    Order Specific Question:   Preferred imaging location?    Answer:   [facility]    Order Specific Question:   Release to patient    Answer:   Immediate   The patient has a good understanding of the overall plan. she agrees with it. she will call with any problems that may develop before the next visit here. Total time spent: 30 mins including face to face time  and time spent for planning, charting and co-ordination of care   Harriette Ohara, MD 10/16/21    I Gardiner Coins am scribing for Dr. Lindi Adie  I have  reviewed the above documentation for accuracy and completeness, and I agree with the above.

## 2021-10-16 ENCOUNTER — Other Ambulatory Visit: Payer: Self-pay

## 2021-10-16 ENCOUNTER — Encounter: Payer: Self-pay | Admitting: *Deleted

## 2021-10-16 ENCOUNTER — Inpatient Hospital Stay: Payer: Self-pay | Attending: Hematology and Oncology | Admitting: Hematology and Oncology

## 2021-10-16 VITALS — BP 191/81 | HR 78 | Temp 98.0°F | Resp 18 | Ht 64.0 in | Wt 134.0 lb

## 2021-10-16 DIAGNOSIS — Z170 Estrogen receptor positive status [ER+]: Secondary | ICD-10-CM

## 2021-10-16 DIAGNOSIS — C50312 Malignant neoplasm of lower-inner quadrant of left female breast: Secondary | ICD-10-CM

## 2021-10-16 DIAGNOSIS — Z9012 Acquired absence of left breast and nipple: Secondary | ICD-10-CM | POA: Insufficient documentation

## 2021-10-16 DIAGNOSIS — L989 Disorder of the skin and subcutaneous tissue, unspecified: Secondary | ICD-10-CM

## 2021-10-16 DIAGNOSIS — Z79811 Long term (current) use of aromatase inhibitors: Secondary | ICD-10-CM | POA: Insufficient documentation

## 2021-10-16 NOTE — Assessment & Plan Note (Addendum)
04/04/2017:Left mastectomy: IDC grade 3, 3.8 cm, intermediate grade DCIS, invasive cancer comes 0.1 cm to posterior margin and less than 0.1 cm to anterior inferior margin at 0/13 lymph nodes, ER 100%, PR 0%, HER-2 positive ratio 2.08 T2 N0 stage IIb  Treatment plan: 1.Adjuvant chemotherapywithTHfollowed by Herceptin maintenance for 1 yearcompleted December 2019. 2. Adjuvant antiestrogen therapywith letrozole --------------------------------------------------------- Current treatment:Letrozole started8/2019 currently taking half a tablet daily Muscle aches and pains Hypertension  Takes mushrooms of all kinds   Mild Neuropathy: Exercising  Letrozole toxicities: Very mild hot flashes and myalgias. She takes letrozole every other day.  Breast cancer surveillance: Right breast mammogram6/29/21: Benign Breast exam 10/16/2021: Benign She needs a new mammogram and I sent a new order to breast center.  She will call and get that scheduled.  Patient takes care of her husband who was unable to walk.    The stress of taking care of him and her mother have been weighing heavily on her.  She overall just does not feel good.  Return to clinicin 1 year for follow-up.

## 2021-10-16 NOTE — Progress Notes (Signed)
Per MD request RN successfully faxed [phone]) referral to Dr. Ubaldo Glassing for evaluation of skin lesions on pt face.

## 2021-10-20 ENCOUNTER — Telehealth: Payer: Self-pay | Admitting: Hematology and Oncology

## 2021-10-20 NOTE — Telephone Encounter (Signed)
Scheduled appointment per 5/26 los. Left message.

## 2022-10-14 NOTE — Progress Notes (Signed)
Patient Care Team: Richarda Overlie, MD as PCP - General (Obstetrics and Gynecology) Serena Croissant, MD as Consulting Physician (Hematology and Oncology) Griselda Miner, MD as Consulting Physician (General Surgery) Bensimhon, Bevelyn Buckles, MD as Consulting Physician (Cardiology)  DIAGNOSIS: No diagnosis found.  SUMMARY OF ONCOLOGIC HISTORY: Oncology History  Malignant neoplasm of lower-inner quadrant of left breast in female, estrogen receptor positive ([facility])  02/28/2017 Initial Diagnosis   Palpable left breast masses 2.5 cm at 7:00 and another 2.5 cm at 6:00 1.9 cm apart biopsy: IDC grade 2 with DCISER 100%, PR 0%, Ki-67 20%, HER-2 positiveratio 2.08, axilla negative; in addition indeterminate calcifications medially and laterally,T2 N0 stage 2A clinical stage AJCC 8   04/04/2017 Surgery   Left mastectomy: IDC grade 3, 3.8 cm, intermediate grade DCIS, invasive cancer comes 0.1 cm to posterior margin and less than 0.1 cm to anterior inferior margin at 0/13 lymph nodes, ER 100%, PR 0%, HER-2 positive ratio 2.08 T2 N0 stage IIb   05/20/2017 - 05/05/2018 Chemotherapy   Taxol (completed Taxol portion 08/05/17) Herceptin weekly x12 followed by Herceptin maintenance every 3 weeks for 1 year (patient refused [facility])    12/2017 -  Anti-estrogen oral therapy   Letrozole daily     CHIEF COMPLIANT:  Follow-up of left breast cancer on letrozole    INTERVAL HISTORY: Adriana Jones is a 67 y.o. with above-mentioned of left breast cancer. She presents to the clinic today for a annual follow-up.    ALLERGIES:  is allergic to amoxicillin and sulfa antibiotics.  MEDICATIONS:  Current Outpatient Medications  Medication Sig Dispense Refill   BORON PO Take 2 mg by mouth daily.     Calcium Carbonate-Vit D-Min (CALCIUM 1200 PO) Take by mouth daily.     letrozole (FEMARA) 2.5 MG tablet TAKE 1 TABLET(2.5 MG) BY MOUTH DAILY 90 tablet 3   Magnesium 100 MG CAPS Take by mouth daily.     OVER THE COUNTER  MEDICATION Take 1,000 mg by mouth daily. AHCC     Vitamin D, Cholecalciferol, 1000 units TABS Take 6,000 Units by mouth daily.     zinc gluconate 50 MG tablet Take 1 tablet (50 mg total) by mouth daily.     No current facility-administered medications for this visit.    PHYSICAL EXAMINATION: ECOG PERFORMANCE STATUS: {CHL ONC ECOG PS:[id number]}  There were no vitals filed for this visit. There were no vitals filed for this visit.  BREAST:*** No palpable masses or nodules in either right or left breasts. No palpable axillary supraclavicular or infraclavicular adenopathy no breast tenderness or nipple discharge. (exam performed in the presence of a chaperone)  LABORATORY DATA:  I have reviewed the data as listed    Latest Ref Rng & Units 05/05/2018    8:52 AM 04/14/2018    8:15 AM 03/24/2018    8:04 AM  CMP  Glucose 70 - 99 mg/dL 161  096  91   BUN 6 - 20 mg/dL 15  17  16    Creatinine 0.44 - 1.00 mg/dL 0.45  4.09  8.11   Sodium 135 - 145 mmol/L 142  140  142   Potassium 3.5 - 5.1 mmol/L 4.2  4.1  4.2   Chloride 98 - 111 mmol/L 107  107  109   CO2 22 - 32 mmol/L 25  26  23    Calcium 8.9 - 10.3 mg/dL 9.2  9.3  9.3   Total Protein 6.5 - 8.1 g/dL 6.8  7.0  6.8  Total Bilirubin 0.3 - 1.2 mg/dL 0.4  0.5  0.4   Alkaline Phos 38 - 126 U/L 87  82  78   AST 15 - 41 U/L 13  25  16    ALT 0 - 44 U/L 13  24  18      Lab Results  Component Value Date   WBC 7.1 05/05/2018   HGB 12.0 05/05/2018   HCT 36.9 05/05/2018   MCV 91.1 05/05/2018   PLT 235 05/05/2018   NEUTROABS 5.2 05/05/2018    ASSESSMENT & PLAN:  No problem-specific Assessment & Plan notes found for this encounter.    No orders of the defined types were placed in this encounter.  The patient has a good understanding of the overall plan. she agrees with it. she will call with any problems that may develop before the next visit here. Total time spent: 30 mins including face to face time and time spent for planning,  charting and co-ordination of care   Sherlyn Lick, CMA 10/14/22    I Janan Ridge am acting as a Neurosurgeon for [employer]  ***

## 2022-10-19 ENCOUNTER — Inpatient Hospital Stay: Payer: BC Managed Care – PPO | Attending: Hematology and Oncology | Admitting: Hematology and Oncology

## 2022-10-19 ENCOUNTER — Telehealth: Payer: Self-pay | Admitting: Hematology and Oncology

## 2022-10-19 VITALS — BP 147/93 | HR 80 | Temp 97.7°F | Resp 18 | Ht 64.0 in | Wt 135.1 lb

## 2022-10-19 DIAGNOSIS — Z170 Estrogen receptor positive status [ER+]: Secondary | ICD-10-CM | POA: Diagnosis not present

## 2022-10-19 DIAGNOSIS — Z08 Encounter for follow-up examination after completed treatment for malignant neoplasm: Secondary | ICD-10-CM | POA: Diagnosis present

## 2022-10-19 DIAGNOSIS — Z853 Personal history of malignant neoplasm of breast: Secondary | ICD-10-CM | POA: Diagnosis present

## 2022-10-19 DIAGNOSIS — C50312 Malignant neoplasm of lower-inner quadrant of left female breast: Secondary | ICD-10-CM

## 2022-10-19 NOTE — Assessment & Plan Note (Addendum)
04/04/2017: Left mastectomy: IDC grade 3, 3.8 cm, intermediate grade DCIS, invasive cancer comes 0.1 cm to posterior margin and less than 0.1 cm to anterior inferior margin at 0/13 lymph nodes, ER 100%, PR 0%, HER-2 positive ratio 2.08 T2 N0 stage IIb   Treatment plan: 1. Adjuvant chemotherapy with TH followed by Herceptin maintenance for 1 year completed December 2019. 2. Adjuvant antiestrogen therapy with letrozole completed March 2024 --------------------------------------------------------- Current treatment: Completed letrozole March 2024 (she was taking it every other day half a tablet)   Takes mushroom supplement     Breast cancer surveillance: Right breast mammogram 11/20/19: Benign (patient does not want to do anymore mammograms.  She is contemplating on getting a thermography scan on the right breast) Breast exam 10/19/2022: Benign     Patient takes care of her husband who had strokes     Return to clinic in 1 year for follow-up.

## 2022-10-19 NOTE — Telephone Encounter (Signed)
Scheduled appointment per 5/28 los. Patient is aware of the made appointment. 

## 2023-10-19 ENCOUNTER — Inpatient Hospital Stay: Payer: BC Managed Care – PPO | Attending: Hematology and Oncology | Admitting: Hematology and Oncology

## 2023-10-19 VITALS — BP 138/72 | HR 79 | Temp 97.7°F | Resp 18 | Ht 64.0 in | Wt 149.9 lb

## 2023-10-19 DIAGNOSIS — Z9012 Acquired absence of left breast and nipple: Secondary | ICD-10-CM | POA: Diagnosis not present

## 2023-10-19 DIAGNOSIS — Z9221 Personal history of antineoplastic chemotherapy: Secondary | ICD-10-CM | POA: Insufficient documentation

## 2023-10-19 DIAGNOSIS — Z170 Estrogen receptor positive status [ER+]: Secondary | ICD-10-CM | POA: Diagnosis not present

## 2023-10-19 DIAGNOSIS — Z08 Encounter for follow-up examination after completed treatment for malignant neoplasm: Secondary | ICD-10-CM | POA: Insufficient documentation

## 2023-10-19 DIAGNOSIS — Z853 Personal history of malignant neoplasm of breast: Secondary | ICD-10-CM | POA: Diagnosis not present

## 2023-10-19 DIAGNOSIS — C50312 Malignant neoplasm of lower-inner quadrant of left female breast: Secondary | ICD-10-CM

## 2023-10-19 NOTE — Progress Notes (Signed)
 Patient Care Team: Woodrow Hazy, MD as PCP - General (Obstetrics and Gynecology) Cameron Cea, MD as Consulting Physician (Hematology and Oncology) Caralyn Chandler, MD as Consulting Physician (General Surgery) Bensimhon, Rheta Celestine, MD as Consulting Physician (Cardiology)  DIAGNOSIS:  Encounter Diagnosis  Name Primary?   Malignant neoplasm of lower-inner quadrant of left breast in female, estrogen receptor positive (HCC) Yes    SUMMARY OF ONCOLOGIC HISTORY: Oncology History  Malignant neoplasm of lower-inner quadrant of left breast in female, estrogen receptor positive (HCC)  02/28/2017 Initial Diagnosis   Palpable left breast masses 2.5 cm at 7:00 and another 2.5 cm at 6:00 1.9 cm apart biopsy: IDC grade 2 with DCISER 100%, PR 0%, Ki-67 20%, HER-2 positiveratio 2.08, axilla negative; in addition indeterminate calcifications medially and laterally,T2 N0 stage 2A clinical stage AJCC 8   04/04/2017 Surgery   Left mastectomy: IDC grade 3, 3.8 cm, intermediate grade DCIS, invasive cancer comes 0.1 cm to posterior margin and less than 0.1 cm to anterior inferior margin at 0/13 lymph nodes, ER 100%, PR 0%, HER-2 positive ratio 2.08 T2 N0 stage IIb   05/20/2017 - 05/05/2018 Chemotherapy   Taxol  (completed Taxol  portion 08/05/17) Herceptin  weekly x12 followed by Herceptin  maintenance every 3 weeks for 1 year (patient refused [facility])    12/2017 - 07/2022 Anti-estrogen oral therapy   Letrozole  daily     CHIEF COMPLIANT: Surveillance of breast cancer  HISTORY OF PRESENT ILLNESS: History of Present Illness Adriana Jones is a 68 year old female who presents for a follow-up visit after a difficult year marked by personal loss and depression.  She has a history of breast cancer, with her last mammogram conducted in 2021. Due to personal circumstances, she has not had a mammogram since then. She expresses concerns about radiation exposure from mammograms and has considered alternative methods  like thermography. No new breast symptoms or changes have been noted since her last mammogram.     ALLERGIES:  is allergic to amoxicillin and sulfa antibiotics.  MEDICATIONS:  Current Outpatient Medications  Medication Sig Dispense Refill   Magnesium 100 MG CAPS Take by mouth daily.     Vitamin D , Cholecalciferol, 1000 units TABS Take 6,000 Units by mouth daily.     zinc  gluconate 50 MG tablet Take 1 tablet (50 mg total) by mouth daily.     zinc  sulfate 220 (50 Zn) MG capsule      No current facility-administered medications for this visit.    PHYSICAL EXAMINATION: ECOG PERFORMANCE STATUS: 1 - Symptomatic but completely ambulatory  Vitals:   10/19/23 0909  BP: 138/72  Pulse: 79  Resp: 18  Temp: 97.7 F (36.5 C)  SpO2: 100%   Filed Weights   10/19/23 0909  Weight: 149 lb 14.4 oz (68 kg)    Physical Exam BREAST: Breasts normal  (exam performed in the presence of a chaperone)  LABORATORY DATA:  I have reviewed the data as listed    Latest Ref Rng & Units 05/05/2018    8:52 AM 04/14/2018    8:15 AM 03/24/2018    8:04 AM  CMP  Glucose 70 - 99 mg/dL 161  096  91   BUN 6 - 20 mg/dL 15  17  16    Creatinine 0.44 - 1.00 mg/dL 0.45  4.09  8.11   Sodium 135 - 145 mmol/L 142  140  142   Potassium 3.5 - 5.1 mmol/L 4.2  4.1  4.2   Chloride 98 - 111 mmol/L  107  107  109   CO2 22 - 32 mmol/L 25  26  23    Calcium 8.9 - 10.3 mg/dL 9.2  9.3  9.3   Total Protein 6.5 - 8.1 g/dL 6.8  7.0  6.8   Total Bilirubin 0.3 - 1.2 mg/dL 0.4  0.5  0.4   Alkaline Phos 38 - 126 U/L 87  82  78   AST 15 - 41 U/L 13  25  16    ALT 0 - 44 U/L 13  24  18      Lab Results  Component Value Date   WBC 7.1 05/05/2018   HGB 12.0 05/05/2018   HCT 36.9 05/05/2018   MCV 91.1 05/05/2018   PLT 235 05/05/2018   NEUTROABS 5.2 05/05/2018    ASSESSMENT & PLAN:  Malignant neoplasm of lower-inner quadrant of left breast in female, estrogen receptor positive (HCC) 04/04/2017: Left mastectomy: IDC grade 3,  3.8 cm, intermediate grade DCIS, invasive cancer comes 0.1 cm to posterior margin and less than 0.1 cm to anterior inferior margin at 0/13 lymph nodes, ER 100%, PR 0%, HER-2 positive ratio 2.08 T2 N0 stage IIb   Treatment plan: 1. Adjuvant chemotherapy with TH followed by Herceptin  maintenance for 1 year completed December 2019. 2. Adjuvant antiestrogen therapy with letrozole  completed March 2024 --------------------------------------------------------- Current treatment: Surveillance Takes mushroom supplement   Breast cancer surveillance: Right breast mammogram 11/20/19: Benign (patient does not want to do anymore mammograms.  She is contemplating on getting a thermography scan on the right breast) Breast exam 10/19/2023: Benign   Her husband passed away from recurrent strokes.  She is still having hard time with adjusting to that.    Return to clinic in 1 year for follow-up.   ------------------------------------- Assessment and Plan Assessment & Plan Depression Significant life stressors, including the loss of her husband, contributed to her depression. She is beginning to emerge from this depressive state. - Encouraged continuation of healthy eating habits, including fresh vegetables. - Consider referral to mental health services if symptoms persist.      No orders of the defined types were placed in this encounter.  The patient has a good understanding of the overall plan. she agrees with it. she will call with any problems that may develop before the next visit here. Total time spent: 30 mins including face to face time and time spent for planning, charting and co-ordination of care   [name], MD 10/19/23

## 2023-10-19 NOTE — Assessment & Plan Note (Signed)
 04/04/2017: Left mastectomy: IDC grade 3, 3.8 cm, intermediate grade DCIS, invasive cancer comes 0.1 cm to posterior margin and less than 0.1 cm to anterior inferior margin at 0/13 lymph nodes, ER 100%, PR 0%, HER-2 positive ratio 2.08 T2 N0 stage IIb   Treatment plan: 1. Adjuvant chemotherapy with TH followed by Herceptin  maintenance for 1 year completed December 2019. 2. Adjuvant antiestrogen therapy with letrozole  completed March 2024 --------------------------------------------------------- Current treatment: Completed letrozole  March 2024 (she was taking it every other day half a tablet)   Takes mushroom supplement   Breast cancer surveillance: Right breast mammogram 11/20/19: Benign (patient does not want to do anymore mammograms.  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

## 2024-10-18 ENCOUNTER — Ambulatory Visit: Admitting: Hematology and Oncology
